# Patient Record
Sex: Female | Born: 1995 | Race: Black or African American | Hispanic: No | Marital: Single | Smoking: Never smoker
Health system: Southern US, Community
[De-identification: ages and names within clinical notes are randomized; demographics above are authoritative.]

## PROBLEM LIST (undated history)

## (undated) DIAGNOSIS — T148XXA Other injury of unspecified body region, initial encounter: Secondary | ICD-10-CM

## (undated) DIAGNOSIS — R011 Cardiac murmur, unspecified: Secondary | ICD-10-CM

## (undated) DIAGNOSIS — E669 Obesity, unspecified: Secondary | ICD-10-CM

---
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
## 2015-08-11 ENCOUNTER — Emergency Department (HOSPITAL_COMMUNITY)
Admission: EM | Admit: 2015-08-11 | Discharge: 2015-08-12 | Disposition: A | Discharge status: Home / Self Care | Payer: Medicaid Other | Attending: Emergency Medicine | Admitting: Emergency Medicine

## 2015-08-11 ENCOUNTER — Emergency Department (HOSPITAL_COMMUNITY): Payer: Medicaid Other

## 2015-08-11 ENCOUNTER — Encounter (HOSPITAL_COMMUNITY): Payer: Self-pay | Admitting: Emergency Medicine

## 2015-08-11 DIAGNOSIS — Z791 Long term (current) use of non-steroidal anti-inflammatories (NSAID): Secondary | ICD-10-CM | POA: Insufficient documentation

## 2015-08-11 DIAGNOSIS — M79641 Pain in right hand: Secondary | ICD-10-CM | POA: Diagnosis present

## 2015-08-11 DIAGNOSIS — Z79899 Other long term (current) drug therapy: Secondary | ICD-10-CM | POA: Diagnosis not present

## 2015-08-11 DIAGNOSIS — M6740 Ganglion, unspecified site: Secondary | ICD-10-CM

## 2015-08-11 DIAGNOSIS — M67441 Ganglion, right hand: Secondary | ICD-10-CM | POA: Insufficient documentation

## 2015-08-11 HISTORY — DX: Cardiac murmur, unspecified: R01.1

## 2015-08-11 MED ORDER — NAPROXEN 375 MG PO TABS: 375.0000 mg | ORAL_TABLET | Freq: Two times a day (BID) | ORAL | 0 refills | Status: DC | PRN

## 2015-08-11 NOTE — ED Triage Notes (Signed)
Patient reports intermittent knot to right hand x7-8 years.  Reports that this has gotten bigger in the past year.  Reports that she hit the knot tonight on a car door and began feeling numbness in the right hand. Presently reports tingling to right hand.

## 2015-08-11 NOTE — ED Provider Notes (Signed)
[facility]-EMERGENCY DEPT Provider Note   CSN: [id number] Arrival date & time: 08/11/15  2200  First Provider Contact:  First MD Initiated Contact with Patient 08/11/15 2230        History   Chief Complaint Chief Complaint  Patient presents with  . Cyst    HPI Cindy Cross is a 20 y.o. female.  HPI  20 yo RHD female who present with mild right hand pain. Pt states she has had a "little bubble" on the back of her hand for several years. Earlier tonight, she bumped the back of her hand and experiences transient shooting pain down to her fingers. Pain is now largely resolved. She has only mild, aching, pain localized to dorsum of her hand. No redness. No fever or chills.  Past Medical History:  Diagnosis Date  . Murmur, cardiac     Patient Active Problem List   Diagnosis Date Noted  . Heart palpitations 06/06/2014    History reviewed. No pertinent surgical history.  OB History    No data available       Home Medications    Prior to Admission medications   Medication Sig Start Date End Date Taking? Authorizing Provider  ibuprofen (ADVIL,MOTRIN) 200 MG tablet Take 400 mg by mouth every 6 (six) hours as needed for moderate pain.   Yes Historical Provider, MD  MICROGESTIN FE 1.5/30 1.5-30 MG-MCG tablet Take 1 tablet by mouth daily.  05/30/14  Yes Historical Provider, MD  naproxen (NAPROSYN) 375 MG tablet Take 1 tablet (375 mg total) by mouth 2 (two) times daily as needed for moderate pain. 08/11/15   Shaune Pollackameron Sheilyn Boehlke, MD    Family History Family History  Problem Relation Age of Onset  . Cancer Maternal Aunt   . Hypertension Maternal Grandmother   . Stroke Maternal Grandmother   . Heart disease Maternal Grandmother   . Diabetes Maternal Grandfather   . Hypertension Maternal Grandfather   . Diabetes Mother   . Asthma Mother     Social History Social History  Substance Use Topics  . Smoking status: Never Smoker  . Smokeless tobacco: Never Used  . Alcohol use No      Allergies   Review of patient's allergies indicates no known allergies.   Review of Systems Review of Systems  Constitutional: Negative for chills, fatigue and fever.  HENT: Negative for congestion and rhinorrhea.   Eyes: Negative for visual disturbance.  Respiratory: Negative for cough, shortness of breath and wheezing.   Cardiovascular: Negative for chest pain and leg swelling.  Gastrointestinal: Negative for abdominal pain, diarrhea, nausea and vomiting.  Genitourinary: Negative for dysuria and flank pain.  Musculoskeletal: Negative for neck pain and neck stiffness.  Skin: Negative for rash and wound.  Allergic/Immunologic: Negative for immunocompromised state.  Neurological: Negative for syncope, weakness and headaches.     Physical Exam Updated Vital Signs BP 138/86 (BP Location: Left Arm)   Pulse 70   Temp 98.6 F (37 C) (Oral)   Resp 18   LMP 07/24/2015 (Exact Date)   SpO2 96%   Physical Exam  Constitutional: She is oriented to person, place, and time. She appears well-developed and well-nourished. No distress.  HENT:  Head: Normocephalic and atraumatic.  Eyes: Conjunctivae are normal.  Neck: Neck supple.  Cardiovascular: Normal rate, regular rhythm and normal heart sounds.   Pulmonary/Chest: Effort normal. No respiratory distress. She has no wheezes.  Abdominal: She exhibits no distension.  Musculoskeletal: She exhibits no edema.  Neurological: She is alert and  oriented to person, place, and time. She exhibits normal muscle tone.  Skin: Skin is warm. Capillary refill takes less than 2 seconds. No rash noted.  Nursing note and vitals reviewed.   UPPER EXTREMITY EXAM: RIGHT  INSPECTION & PALPATION: Small, mobile, non-tender subdermal cyst on dorsum of hand overlying extensor tendon of middle finger at wrist. No overlying skin erythema or redness. No fluctuance.  SENSORY: Sensation is intact to light touch in:  Superficial radial nerve distribution  (dorsal first web space) Median nerve distribution (tip of index finger)   Ulnar nerve distribution (tip of small finger)     MOTOR:  + Motor posterior interosseous nerve (thumb IP extension) + Anterior interosseous nerve (thumb IP flexion, index finger DIP flexion) + Radial nerve (wrist extension) + Median nerve (palpable firing thenar mass) + Ulnar nerve (palpable firing of first dorsal interosseous muscle)  VASCULAR: 2+ radial pulse Brisk capillary refill < 2 sec, fingers warm and well-perfused  TENDONS: Tested individual flexion at MCP, PIP and DIP joints of index, long, ring and small fingers and all are intact. Tested extension of DIP/PIP joints of index, long, ring and small fingers and all intact.  COMPARTMENTS: Soft, warm, well-perfused No pain with passive extension No paresthesias  ED Treatments / Results  Labs (all labs ordered are listed, but only abnormal results are displayed) Labs Reviewed - No data to display  EKG  EKG Interpretation None       Radiology Dg Hand Complete Right  Result Date: 08/11/2015 CLINICAL DATA:  Chronic knot at the medial aspect of the right hand. Acute onset of numbness after hitting knot on car door. Initial encounter. EXAM: RIGHT HAND - COMPLETE 3+ VIEW COMPARISON:  None. FINDINGS: There is no evidence of fracture or dislocation. The joint spaces are preserved. The carpal rows are intact, and demonstrate normal alignment. The known soft tissue nodule is not well characterized on radiograph. No radiopaque foreign bodies are seen. IMPRESSION: No evidence of fracture or dislocation. Electronically Signed   By: Roanna Raider M.D.   On: 08/11/2015 22:47    Procedures Procedures (including critical care time)  Medications Ordered in ED Medications - No data to display   Initial Impression / Assessment and Plan / ED Course  I have reviewed the triage vital signs and the nursing notes.  Pertinent labs & imaging results that were  available during my care of the patient were reviewed by me and considered in my medical decision making (see chart for details).  Clinical Course   20 yo F with no significant PMHx who p/w transient pain due to striking dorsum of hand against hard object. Exam as above and is c/w mild ganglion cyst. No surrounding erythema, redness, fluctuance, ro signs of superinfection. Cyst is small and is o/w asymptomatic. No tendon injury. Plain films show no fx. Will advise supportive care, outpt referral for possible cyst aspiration/removal. Pt in agreement.  Final Clinical Impressions(s) / ED Diagnoses   Final diagnoses:  Ganglion cyst    New Prescriptions Discharge Medication List as of 08/11/2015 11:18 PM       Shaune Pollack, MD 08/12/15 1158

## 2015-11-02 ENCOUNTER — Encounter: Payer: Self-pay | Admitting: Obstetrics & Gynecology

## 2015-12-14 ENCOUNTER — Ambulatory Visit (INDEPENDENT_AMBULATORY_CARE_PROVIDER_SITE_OTHER): Payer: Medicaid Other | Admitting: Obstetrics & Gynecology

## 2015-12-14 ENCOUNTER — Other Ambulatory Visit (HOSPITAL_COMMUNITY)
Admission: RE | Admit: 2015-12-14 | Discharge: 2015-12-14 | Disposition: A | Discharge status: Home / Self Care | Payer: Medicaid Other | Source: Ambulatory Visit | Attending: Obstetrics & Gynecology | Admitting: Obstetrics & Gynecology

## 2015-12-14 ENCOUNTER — Encounter: Payer: Self-pay | Admitting: Obstetrics & Gynecology

## 2015-12-14 VITALS — BP 156/77 | HR 69 | Ht 63.0 in | Wt 208.0 lb

## 2015-12-14 DIAGNOSIS — Z202 Contact with and (suspected) exposure to infections with a predominantly sexual mode of transmission: Secondary | ICD-10-CM

## 2015-12-14 DIAGNOSIS — Z113 Encounter for screening for infections with a predominantly sexual mode of transmission: Secondary | ICD-10-CM | POA: Insufficient documentation

## 2015-12-14 DIAGNOSIS — Z23 Encounter for immunization: Secondary | ICD-10-CM

## 2015-12-14 MED ORDER — HPV QUADRIVALENT VACCINE IM SUSP: 0.5000 mL | Freq: Once | INTRAMUSCULAR | 0 refills | Status: AC

## 2015-12-14 NOTE — Progress Notes (Signed)
   Subjective:    Patient ID: Cindy Cross, female    DOB: 09/24/1995, 20 y.o.   MRN: [id number]  HPI  20 yo S AA here as a referral after her primary care saw a red area on her cervix during her pap smear last month.  Review of Systems She was treated for + CT but has not had a TOC. She uses Nexplanon for contraception.    Objective:   Physical Exam Obese pleasant black female Breathing, conversing, and ambulating normally Cervix- normal appearing ectropion (discussion about this done) Some menstrual blood noted No abnormality noted    Assessment & Plan:  Preventative care- start Gardasil today H/O + CT- check TOC Reassurance given regarding the ectropion RTC 2 months for Gardasill #2

## 2015-12-15 LAB — GC/CHLAMYDIA PROBE AMP (~~LOC~~) NOT AT ARMC
CHLAMYDIA, DNA PROBE: NEGATIVE
Neisseria Gonorrhea: NEGATIVE

## 2016-02-15 ENCOUNTER — Ambulatory Visit (INDEPENDENT_AMBULATORY_CARE_PROVIDER_SITE_OTHER): Payer: Medicaid Other | Admitting: *Deleted

## 2016-02-15 ENCOUNTER — Encounter: Payer: Self-pay | Admitting: Obstetrics & Gynecology

## 2016-02-15 ENCOUNTER — Other Ambulatory Visit (HOSPITAL_COMMUNITY)
Admission: RE | Admit: 2016-02-15 | Discharge: 2016-02-15 | Disposition: A | Discharge status: Home / Self Care | Payer: Medicaid Other | Source: Ambulatory Visit | Attending: Obstetrics & Gynecology | Admitting: Obstetrics & Gynecology

## 2016-02-15 DIAGNOSIS — Z113 Encounter for screening for infections with a predominantly sexual mode of transmission: Secondary | ICD-10-CM

## 2016-02-15 DIAGNOSIS — B9689 Other specified bacterial agents as the cause of diseases classified elsewhere: Secondary | ICD-10-CM

## 2016-02-15 DIAGNOSIS — Z23 Encounter for immunization: Secondary | ICD-10-CM | POA: Diagnosis present

## 2016-02-15 DIAGNOSIS — N760 Acute vaginitis: Secondary | ICD-10-CM

## 2016-02-16 LAB — CERVICOVAGINAL ANCILLARY ONLY
Bacterial vaginitis: POSITIVE — AB
CANDIDA VAGINITIS: NEGATIVE
Chlamydia: NEGATIVE
Neisseria Gonorrhea: NEGATIVE
TRICH (WINDOWPATH): NEGATIVE

## 2016-02-17 MED ORDER — METRONIDAZOLE 500 MG PO TABS: 500.0000 mg | ORAL_TABLET | Freq: Two times a day (BID) | ORAL | 0 refills | Status: DC

## 2016-02-17 NOTE — Progress Notes (Addendum)
Patient was seen as a RN visit for abnormal vaginal discharge, also desires STI testing. Testing done, showed bacterial vaginitis.  Results for orders placed or performed in visit on 02/15/16 (from the past 72 hour(s))  Cervicovaginal ancillary only     Status: Abnormal   Collection Time: 02/15/16 12:00 AM  Result Value Ref Range   Chlamydia Negative     Comment: Normal Reference Range - Negative   Neisseria gonorrhea Negative     Comment: Normal Reference Range - Negative   Bacterial vaginitis **POSITIVE for Gardnerella vaginalis** (A)     Comment: Normal Reference Range - Negative   Candida vaginitis Negative for Candida species     Comment: Normal Reference Range - Negative   Trichomonas Negative     Comment: Normal Reference Range - Negative    Metronidazole prescribed for patient; she will be contacted by RN and instructed to pick up prescription.   Jaynie CollinsUGONNA  Clemens Lachman, MD, FACOG Attending Obstetrician & Gynecologist, [facility], [facility]

## 2016-02-17 NOTE — Addendum Note (Signed)
Addended by: Jaynie CollinsANYANWU, Amilio Zehnder A on: 02/17/2016 03:02 PM   Modules accepted: Orders, Level of Service

## 2016-03-24 ENCOUNTER — Encounter: Payer: Self-pay | Admitting: *Deleted

## 2016-06-13 ENCOUNTER — Ambulatory Visit: Payer: Medicaid Other

## 2016-06-14 ENCOUNTER — Ambulatory Visit (INDEPENDENT_AMBULATORY_CARE_PROVIDER_SITE_OTHER): Payer: Medicaid Other

## 2016-06-14 VITALS — BP 135/77 | HR 71

## 2016-06-14 DIAGNOSIS — Z23 Encounter for immunization: Secondary | ICD-10-CM

## 2016-06-14 NOTE — Progress Notes (Signed)
Patient presented to office today for HPV #3. Patient tolerated well.

## 2016-10-06 ENCOUNTER — Ambulatory Visit (INDEPENDENT_AMBULATORY_CARE_PROVIDER_SITE_OTHER): Payer: Self-pay | Admitting: Clinical

## 2016-10-06 ENCOUNTER — Encounter: Payer: Self-pay | Admitting: Obstetrics & Gynecology

## 2016-10-06 ENCOUNTER — Other Ambulatory Visit (HOSPITAL_COMMUNITY)
Admission: RE | Admit: 2016-10-06 | Discharge: 2016-10-06 | Disposition: A | Discharge status: Home / Self Care | Payer: Medicaid Other | Source: Ambulatory Visit | Attending: Obstetrics & Gynecology | Admitting: Obstetrics & Gynecology

## 2016-10-06 ENCOUNTER — Ambulatory Visit (INDEPENDENT_AMBULATORY_CARE_PROVIDER_SITE_OTHER): Payer: Medicaid Other | Admitting: Obstetrics & Gynecology

## 2016-10-06 VITALS — BP 156/104 | HR 79 | Wt 207.1 lb

## 2016-10-06 DIAGNOSIS — Z1331 Encounter for screening for depression: Secondary | ICD-10-CM

## 2016-10-06 DIAGNOSIS — Z309 Encounter for contraceptive management, unspecified: Secondary | ICD-10-CM | POA: Diagnosis present

## 2016-10-06 DIAGNOSIS — F332 Major depressive disorder, recurrent severe without psychotic features: Secondary | ICD-10-CM

## 2016-10-06 DIAGNOSIS — Z01419 Encounter for gynecological examination (general) (routine) without abnormal findings: Secondary | ICD-10-CM | POA: Diagnosis present

## 2016-10-06 DIAGNOSIS — B9689 Other specified bacterial agents as the cause of diseases classified elsewhere: Secondary | ICD-10-CM | POA: Insufficient documentation

## 2016-10-06 NOTE — Progress Notes (Signed)
Subjective:    Cindy Cross is a 21 y.o. S AA G0  female who presents for an annual exam. The patient has no complaints today except for vaginal itching/discharge. Coitarche at 17, body count "more than 5 and less than 15". The patient is sexually active. GYN screening history: no prior history of gyn screening tests. The patient wears seatbelts: yes. The patient participates in regular exercise: yes. Has the patient ever been transfused or tattooed?: yes. The patient reports that there is not domestic violence in her life.   Menstrual History: OB History    No data available      Menarche age: 60 No LMP recorded. Patient has had an implant.    The following portions of the patient's history were reviewed and updated as appropriate: allergies, current medications, past family history, past medical history, past social history, past surgical history and problem list.  Review of Systems Pertinent items are noted in HPI.   Student at [employer], Multimedia programmer Uses condoms at times., She had CT about a year ago. FH- +breast cancer- maunt, no gyn or colon cancer Periods are irregular, but monthly and last up to 30 days Has Nexplanon She has been monogamous for 7 years.   Objective:    BP (!) 156/104   Pulse 79   Wt 207 lb 1.6 oz (93.9 kg)   BMI 36.69 kg/m   General Appearance:    Alert, cooperative, no distress, appears stated age  Head:    Normocephalic, without obvious abnormality, atraumatic  Eyes:    PERRL, conjunctiva/corneas clear, EOM's intact, fundi    benign, both eyes  Ears:    Normal TM's and external ear canals, both ears  Nose:   Nares normal, septum midline, mucosa normal, no drainage    or sinus tenderness  Throat:   Lips, mucosa, and tongue normal; teeth and gums normal  Neck:   Supple, symmetrical, trachea midline, no adenopathy;    thyroid:  no enlargement/tenderness/nodules; no carotid   bruit or JVD  Back:     Symmetric, no curvature, ROM normal,  no CVA tenderness  Lungs:     Clear to auscultation bilaterally, respirations unlabored  Chest Wall:    No tenderness or deformity   Heart:    RRR with 3/6 SEM  Breast Exam:    No tenderness, masses, or nipple abnormality  Abdomen:     Soft, non-tender, bowel sounds active all four quadrants,    no masses, no organomegaly  Genitalia:    Normal female without lesion, discharge or tenderness, bloody mucous discharge, NSSA, mobile, NT, no adnexal masses or tenderness     Extremities:   Extremities normal, atraumatic, no cyanosis or edema  Pulses:   2+ and symmetric all extremities  Skin:   Skin color, texture, turgor normal, no rashes or lesions  Lymph nodes:   Cervical, supraclavicular, and axillary nodes normal  Neurologic:   CNII-XII intact, normal strength, sensation and reflexes    throughout  .    Assessment:    Healthy female exam.    Plan:     Thin prep Pap smear.   STI testing She will make appt with FP re her BP Rec healthy lifestyle choices

## 2016-10-06 NOTE — Patient Instructions (Signed)
My Safety Plan:   Step 1: Warning signs (thoughts, images, mood, situation, behavior) that a crisis may be developing: If stress becomes overwhelming  Step 2: Internal coping strategies: Things I can do to take my mind off my problems without contacting another person (music, relaxation technique, physical activity): music  Step 3: People I can ask for help:  Name, relationship, contact: Ardelle Park, [phone] Name, relationship, contact: Iris, [phone] Name, relationship, contact: Johnny Bridge, (number in phone)_______________________  Step 5: Agencies I can contact during a crisis:      1. 9-1-1     2. [employer] (24/7 walk-in) [address], [place], [place]     3. [facility]: Intake- [id number] [phone]     4. Closest Emergency Room Address: [place] below)  Suicide Prevention Lifeline Phone: [phone]  Step 6: Making the environment safe- Have friend or family remove from home: Mommy will do this at home * Weapons in the home * Medication in the home (including Tylenol)  Step 7: The one thing that is most important to me and worth living for is: Tour manager of Patient: _________________________________________________  Public house manager of Provider: ________________________________________________  [facility] [address] [place], [place], [place] [phone]  Eligha Bridegroom. [facility] [address], [place], [place] [phone]  [facility] [address] [place], [place] [phone]  [facility] [address] [phone]  [facility] [address], [place], [place] [phone]

## 2016-10-06 NOTE — BH Specialist Note (Signed)
Integrated Behavioral Health Initial Visit  MRN: [id number] Name: Cindy Cross  Number of Integrated Behavioral Health Clinician visits:: 1/6 Session Start time: 4:15  Session End time: 5:15 Total time: 1 hour  Type of [id number] Behavioral Health- Individual/Family Interpretor:No. Interpretor Name and Language: n/a   Warm Hand Off Completed.       SUBJECTIVE: Cindy Cross is a 21 y.o. female accompanied by n/a Patient was referred by Dr Marice Potter for depression. Patient reports the following symptoms/concerns: Pt states her primary concern today are recent thoughts of childhood losses(grandmother and father), and the realization that she needs to find ways to cope with daily stress. Pt says she is not currently suicidal, denies any current plan of harm to self.  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 Reduce symptoms of: anxiety, depression and stress 2. Increase knowledge and/or ability of: coping skills  3. Demonstrate ability to: Increase healthy adjustment to current life circumstances and Increase adequate support systems for patient/family  INTERVENTIONS: Interventions utilized: Solution-Focused Strategies, Mindfulness or Management consultant and Psychoeducation and/or Health Education  Standardized Assessments completed: GAD-7 and PHQ 2&9 with C-SSRS Pt denies current SI, says she has been reminded of loss and suicide attempt from childhood, after her  grandmother and father passed away; has had no plan or attempt since that time. Thinking about her past losses have increased her feelings of depression recently.   ASSESSMENT: Patient currently experiencing Major depressive disorder, recurrent, severe, without psychotic features.   Patient may benefit from psychoeducation and brief therapeutic intervention regarding coping with symptoms of anxiety and depression, and referral to community mental health.  PLAN: 1. Follow up with behavioral health clinician on :  Phone f/u 4 days 2. Behavioral recommendations:  -Follow safety plan -Make an appointment at [facility] -Consider establishing care at either [facility] or [facility] for [facility] med management -CALM relaxation breathing exercise daily, as needed -Consider Worry Hour strategy to prioritize current life stress -Consider spending more time with family pets -Read educational materials regarding coping with symptoms of anxiety with panic and depression 3. Referral(s): Integrated Art gallery manager (In Clinic) and [facility] (LME/Outside Clinic) 4. "From scale of 1-10, how likely are you to follow plan?": 8  Rae Lips, LCSWA    Depression screen [facility] 2/9 10/06/2016 12/14/2015  Decreased Interest 0 1  Down, Depressed, Hopeless 2 2  PHQ - 2 Score 2 3  Altered sleeping 2 1  Tired, decreased energy 2 1  Change in appetite 1 1  Feeling bad or failure about yourself  2 1  Trouble concentrating 1 1  Moving slowly or fidgety/restless 1 0  Suicidal thoughts 2 1  PHQ-9 Score 13 9   GAD 7 : Generalized Anxiety Score 10/06/2016 12/14/2015  Nervous, Anxious, on Edge 3 2  Control/stop worrying 3 2  Worry too much - different things 3 2  Trouble relaxing 2 0  Restless 1 0  Easily annoyed or irritable 2 3  Afraid - awful  might happen 3 0  Total GAD 7 Score 17 9

## 2016-10-07 LAB — CYTOLOGY - PAP
Bacterial vaginitis: POSITIVE — AB
CANDIDA VAGINITIS: NEGATIVE
CHLAMYDIA, DNA PROBE: NEGATIVE
DIAGNOSIS: NEGATIVE
Neisseria Gonorrhea: NEGATIVE
Trichomonas: NEGATIVE

## 2016-10-07 LAB — RPR: RPR Ser Ql: NONREACTIVE

## 2016-10-07 LAB — HEPATITIS PANEL, ACUTE
HEP B S AG: NEGATIVE
Hep A IgM: NEGATIVE
Hep B C IgM: NEGATIVE

## 2016-10-07 LAB — HIV ANTIBODY (ROUTINE TESTING W REFLEX): HIV SCREEN 4TH GENERATION: NONREACTIVE

## 2016-10-10 ENCOUNTER — Telehealth: Payer: Self-pay | Admitting: Clinical

## 2016-10-10 NOTE — Telephone Encounter (Signed)
Left HIPPA-compliant message to return call to Jream Broyles at [facility] at [phone].      

## 2017-04-26 ENCOUNTER — Ambulatory Visit (INDEPENDENT_AMBULATORY_CARE_PROVIDER_SITE_OTHER): Payer: PRIVATE HEALTH INSURANCE

## 2017-04-26 ENCOUNTER — Other Ambulatory Visit: Payer: Self-pay

## 2017-04-26 ENCOUNTER — Ambulatory Visit (HOSPITAL_COMMUNITY)
Admission: EM | Admit: 2017-04-26 | Discharge: 2017-04-26 | Disposition: A | Discharge status: Home / Self Care | Payer: PRIVATE HEALTH INSURANCE | Attending: Family Medicine | Admitting: Family Medicine

## 2017-04-26 ENCOUNTER — Encounter (HOSPITAL_COMMUNITY): Payer: Self-pay | Admitting: Emergency Medicine

## 2017-04-26 DIAGNOSIS — S82832A Other fracture of upper and lower end of left fibula, initial encounter for closed fracture: Secondary | ICD-10-CM
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
## 2017-04-26 NOTE — Discharge Instructions (Addendum)
You may use over the counter ibuprofen or acetaminophen as needed. Wear your walking boot until orthopaedic follow up.

## 2017-05-04 ENCOUNTER — Encounter (HOSPITAL_COMMUNITY): Payer: Self-pay | Admitting: *Deleted

## 2017-05-04 ENCOUNTER — Other Ambulatory Visit: Payer: Self-pay

## 2017-05-04 NOTE — Progress Notes (Signed)
Anesthesia Chart Review:  Pt is a same day work up   Case:  [id number] Date/Time:  05/08/17 1115   Procedure:  OPEN REDUCTION INTERNAL FIXATION (ORIF) LEFT ANKLE FRACTURE (Left )   Anesthesia type:  General   Pre-op diagnosis:  Left ankle fracture   Location:  [facility] OR ROOM 05 / [facility] OR   Surgeon:  Cindy Kida, MD      DISCUSSION: Pt is a 22 year old female.    PROVIDERS:  PCP is Cindy Sessions, NP    Saw cardiologist Cindy Shutter, MD 06/06/14 for palpitations.  SEM heard, thought physiologic.  Echo (see below) showed aortic sclerosis.  Pt does not regularly f/u with cardiology.     LABS: Will be obtained day of surgery   EKG: Will be obtained day of surgery    CV:  Echo 06/18/14:  - Left ventricle: The cavity size was normal. Systolic function was normal. The estimated ejection fraction was in the range of 55% to 60%. Wall motion was normal; there were no regional wall motion abnormalities. - Aortic valve: Trileaflet; mildly thickened, mildly calcified leaflets. - Mitral valve: There was trivial regurgitation. - Tricuspid valve: There was trivial regurgitation.   Past Medical History:  Diagnosis Date  . Fracture    left fibula  . Murmur, cardiac   . Obesity     History reviewed. No pertinent surgical history.  MEDICATIONS: No current facility-administered medications for this encounter.    . etonogestrel (NEXPLANON) 68 MG IMPL implant  . ibuprofen (ADVIL,MOTRIN) 200 MG tablet    If labs and EKG acceptable day of surgery, I anticipate pt can proceed with surgery as scheduled.  Cindy Mast, FNP-BC [facility] Short Stay Surgical Center/Anesthesiology Phone: [phone] 05/04/2017 3:50 PM
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
## 2017-05-04 NOTE — Progress Notes (Signed)
Pt denies SOB, chest pain, and being under the care of a cardiologist. Pt denies having a stress test and cardiac cath. Pt denies having an EKG and chest x ray within the last year. Pt denies recent labs.  Pt made aware to stop taking vitamins, fish oil and herbal medications. Do not take any NSAIDs ie: Ibuprofen, Advil, Naproxen (Aleve), Motrin, BC and Goody Powder. Pt verbalized understanding of all pre-op instructions. See anesthesia note.

## 2017-05-08 ENCOUNTER — Encounter (HOSPITAL_COMMUNITY): Payer: Self-pay | Admitting: *Deleted

## 2017-05-08 ENCOUNTER — Ambulatory Visit (HOSPITAL_COMMUNITY): Payer: PRIVATE HEALTH INSURANCE

## 2017-05-08 ENCOUNTER — Ambulatory Visit (HOSPITAL_COMMUNITY)
Admission: RE | Admit: 2017-05-08 | Discharge: 2017-05-08 | Disposition: A | Discharge status: Home / Self Care | Payer: PRIVATE HEALTH INSURANCE | Source: Ambulatory Visit | Attending: Orthopedic Surgery | Admitting: Orthopedic Surgery

## 2017-05-08 ENCOUNTER — Ambulatory Visit (HOSPITAL_COMMUNITY): Payer: PRIVATE HEALTH INSURANCE | Admitting: Emergency Medicine

## 2017-05-08 ENCOUNTER — Encounter (HOSPITAL_COMMUNITY)
Admission: RE | Disposition: A | Discharge status: Home / Self Care | Payer: Self-pay | Source: Ambulatory Visit | Attending: Orthopedic Surgery

## 2017-05-08 DIAGNOSIS — S82842A Displaced bimalleolar fracture of left lower leg, initial encounter for closed fracture: Secondary | ICD-10-CM | POA: Insufficient documentation

## 2017-05-08 DIAGNOSIS — Y929 Unspecified place or not applicable: Secondary | ICD-10-CM | POA: Diagnosis not present

## 2017-05-08 DIAGNOSIS — W19XXXA Unspecified fall, initial encounter: Secondary | ICD-10-CM | POA: Insufficient documentation

## 2017-05-08 DIAGNOSIS — Z419 Encounter for procedure for purposes other than remedying health state, unspecified: Secondary | ICD-10-CM

## 2017-05-08 DIAGNOSIS — S93432A Sprain of tibiofibular ligament of left ankle, initial encounter: Secondary | ICD-10-CM | POA: Diagnosis not present

## 2017-05-08 DIAGNOSIS — S82892A Other fracture of left lower leg, initial encounter for closed fracture: Secondary | ICD-10-CM

## 2017-05-08 HISTORY — DX: Other injury of unspecified body region, initial encounter: T14.8XXA

## 2017-05-08 HISTORY — PX: ORIF ANKLE FRACTURE: SHX5408

## 2017-05-08 HISTORY — DX: Obesity, unspecified: E66.9

## 2017-05-08 LAB — CBC
HCT: 42.9 % (ref 36.0–46.0)
HEMOGLOBIN: 14 g/dL (ref 12.0–15.0)
MCH: 29.3 pg (ref 26.0–34.0)
MCHC: 32.6 g/dL (ref 30.0–36.0)
MCV: 89.7 fL (ref 78.0–100.0)
Platelets: 295 10*3/uL (ref 150–400)
RBC: 4.78 MIL/uL (ref 3.87–5.11)
RDW: 13.2 % (ref 11.5–15.5)
WBC: 4.6 10*3/uL (ref 4.0–10.5)

## 2017-05-08 LAB — BASIC METABOLIC PANEL
Anion gap: 11 (ref 5–15)
BUN: 12 mg/dL (ref 6–20)
CHLORIDE: 103 mmol/L (ref 101–111)
CO2: 22 mmol/L (ref 22–32)
Calcium: 9.2 mg/dL (ref 8.9–10.3)
Creatinine, Ser: 0.81 mg/dL (ref 0.44–1.00)
GFR calc Af Amer: >60 mL/min (ref >60)
GFR calc non Af Amer: >60 mL/min (ref >60)
GLUCOSE: 94 mg/dL (ref 65–99)
POTASSIUM: 3.5 mmol/L (ref 3.5–5.1)
SODIUM: 136 mmol/L (ref 135–145)

## 2017-05-08 LAB — POCT PREGNANCY, URINE: PREG TEST UR: NEGATIVE

## 2017-05-08 SURGERY — OPEN REDUCTION INTERNAL FIXATION (ORIF) ANKLE FRACTURE
Anesthesia: General | Site: Ankle | Laterality: Left

## 2017-05-08 MED ORDER — OXYCODONE HCL 5 MG/5ML PO SOLN: 5.0000 mg | Freq: Once | ORAL | Status: DC | PRN

## 2017-05-08 MED ORDER — PROPOFOL 10 MG/ML IV BOLUS
INTRAVENOUS | Status: AC
  Filled 2017-05-08: qty 20

## 2017-05-08 MED ORDER — PROPOFOL 10 MG/ML IV BOLUS
INTRAVENOUS | Status: DC | PRN
  Administered 2017-05-08: 150 mg via INTRAVENOUS
  Administered 2017-05-08: 200 mg via INTRAVENOUS

## 2017-05-08 MED ORDER — FENTANYL CITRATE (PF) 100 MCG/2ML IJ SOLN
INTRAMUSCULAR | Status: AC
  Administered 2017-05-08: 50 ug
  Filled 2017-05-08: qty 2

## 2017-05-08 MED ORDER — FENTANYL CITRATE (PF) 250 MCG/5ML IJ SOLN
INTRAMUSCULAR | Status: DC | PRN
  Administered 2017-05-08: 50 ug via INTRAVENOUS

## 2017-05-08 MED ORDER — ONDANSETRON HCL 4 MG/2ML IJ SOLN
INTRAMUSCULAR | Status: DC | PRN
  Administered 2017-05-08: 4 mg via INTRAVENOUS

## 2017-05-08 MED ORDER — BUPIVACAINE HCL (PF) 0.25 % IJ SOLN
INTRAMUSCULAR | Status: AC
  Filled 2017-05-08: qty 30

## 2017-05-08 MED ORDER — PROMETHAZINE HCL 25 MG/ML IJ SOLN: 6.2500 mg | INTRAMUSCULAR | Status: DC | PRN

## 2017-05-08 MED ORDER — DEXAMETHASONE SODIUM PHOSPHATE 10 MG/ML IJ SOLN
INTRAMUSCULAR | Status: DC | PRN
  Administered 2017-05-08: 10 mg via INTRAVENOUS

## 2017-05-08 MED ORDER — ONDANSETRON 4 MG PO TBDP: 4.0000 mg | ORAL_TABLET | Freq: Three times a day (TID) | ORAL | 0 refills | Status: DC | PRN

## 2017-05-08 MED ORDER — MIDAZOLAM HCL 2 MG/2ML IJ SOLN
INTRAMUSCULAR | Status: AC
  Filled 2017-05-08: qty 2

## 2017-05-08 MED ORDER — 0.9 % SODIUM CHLORIDE (POUR BTL) OPTIME
TOPICAL | Status: DC | PRN
  Administered 2017-05-08: 1000 mL

## 2017-05-08 MED ORDER — LACTATED RINGERS IV SOLN
INTRAVENOUS | Status: DC
  Administered 2017-05-08 (×2): via INTRAVENOUS

## 2017-05-08 MED ORDER — CHLORHEXIDINE GLUCONATE 4 % EX LIQD: 60.0000 mL | Freq: Once | CUTANEOUS | Status: DC

## 2017-05-08 MED ORDER — OXYCODONE HCL 5 MG PO TABS: 5.0000 mg | ORAL_TABLET | ORAL | 0 refills | Status: AC | PRN

## 2017-05-08 MED ORDER — CEFAZOLIN SODIUM-DEXTROSE 2-4 GM/100ML-% IV SOLN
2.0000 g | INTRAVENOUS | Status: AC
  Administered 2017-05-08: 2 g via INTRAVENOUS
  Filled 2017-05-08: qty 100

## 2017-05-08 MED ORDER — OXYCODONE HCL 5 MG PO TABS: 5.0000 mg | ORAL_TABLET | Freq: Once | ORAL | Status: DC | PRN

## 2017-05-08 MED ORDER — FENTANYL CITRATE (PF) 100 MCG/2ML IJ SOLN: 25.0000 ug | INTRAMUSCULAR | Status: DC | PRN

## 2017-05-08 MED ORDER — DEXMEDETOMIDINE HCL 200 MCG/2ML IV SOLN
INTRAVENOUS | Status: DC | PRN
  Administered 2017-05-08 (×4): 4 ug via INTRAVENOUS

## 2017-05-08 MED ORDER — MIDAZOLAM HCL 2 MG/2ML IJ SOLN
INTRAMUSCULAR | Status: AC
  Administered 2017-05-08: 2 mg
  Filled 2017-05-08: qty 2

## 2017-05-08 MED ORDER — LIDOCAINE 2% (20 MG/ML) 5 ML SYRINGE
INTRAMUSCULAR | Status: DC | PRN
  Administered 2017-05-08: 60 mg via INTRAVENOUS

## 2017-05-08 MED ORDER — FENTANYL CITRATE (PF) 250 MCG/5ML IJ SOLN
INTRAMUSCULAR | Status: AC
  Filled 2017-05-08: qty 5

## 2017-05-08 MED ORDER — BUPIVACAINE-EPINEPHRINE (PF) 0.5% -1:200000 IJ SOLN
INTRAMUSCULAR | Status: DC | PRN
  Administered 2017-05-08: 30 mL via PERINEURAL
  Administered 2017-05-08: 20 mL via PERINEURAL

## 2017-05-08 SURGICAL SUPPLY — 50 items
BANDAGE ELASTIC 4 VELCRO ST LF (GAUZE/BANDAGES/DRESSINGS) ×2 IMPLANT
BANDAGE ELASTIC 6 VELCRO ST LF (GAUZE/BANDAGES/DRESSINGS) ×2 IMPLANT
BANDAGE ESMARK 6X9 LF (GAUZE/BANDAGES/DRESSINGS) ×1 IMPLANT
BIT DRILL 2.5 CANN ENDOSCOPIC (BIT) ×2 IMPLANT
BIT DRILL 3.5 CANN STRL (BIT) ×2 IMPLANT
BNDG ESMARK 6X9 LF (GAUZE/BANDAGES/DRESSINGS) ×2
CANISTER SUCT 3000ML PPV (MISCELLANEOUS) ×2 IMPLANT
COVER SURGICAL LIGHT HANDLE (MISCELLANEOUS) ×2 IMPLANT
CUFF TOURNIQUET SINGLE 34IN LL (TOURNIQUET CUFF) ×2 IMPLANT
DRAPE C-ARM 42X72 X-RAY (DRAPES) ×2 IMPLANT
DRAPE C-ARMOR (DRAPES) ×2 IMPLANT
DRAPE U-SHAPE 47X51 STRL (DRAPES) ×2 IMPLANT
DURAPREP 26ML APPLICATOR (WOUND CARE) ×4 IMPLANT
ELECT REM PT RETURN 9FT ADLT (ELECTROSURGICAL) ×2
ELECTRODE REM PT RTRN 9FT ADLT (ELECTROSURGICAL) ×1 IMPLANT
GAUZE SPONGE 4X4 12PLY STRL (GAUZE/BANDAGES/DRESSINGS) ×2 IMPLANT
GLOVE BIO SURGEON STRL SZ7.5 (GLOVE) ×2 IMPLANT
GLOVE BIOGEL PI IND STRL 8 (GLOVE) ×1 IMPLANT
GLOVE BIOGEL PI INDICATOR 8 (GLOVE) ×1
GOWN STRL REUS W/ TWL LRG LVL3 (GOWN DISPOSABLE) ×2 IMPLANT
GOWN STRL REUS W/ TWL XL LVL3 (GOWN DISPOSABLE) ×1 IMPLANT
GOWN STRL REUS W/TWL LRG LVL3 (GOWN DISPOSABLE) ×2
GOWN STRL REUS W/TWL XL LVL3 (GOWN DISPOSABLE) ×1
KIT BASIN OR (CUSTOM PROCEDURE TRAY) ×2 IMPLANT
KIT TURNOVER KIT B (KITS) ×2 IMPLANT
NS IRRIG 1000ML POUR BTL (IV SOLUTION) ×2 IMPLANT
PACK ORTHO EXTREMITY (CUSTOM PROCEDURE TRAY) ×2 IMPLANT
PAD ABD 8X10 STRL (GAUZE/BANDAGES/DRESSINGS) ×2 IMPLANT
PAD ARMBOARD 7.5X6 YLW CONV (MISCELLANEOUS) ×4 IMPLANT
PAD CAST 4YDX4 CTTN HI CHSV (CAST SUPPLIES) ×1 IMPLANT
PADDING CAST COTTON 4X4 STRL (CAST SUPPLIES) ×1
PADDING CAST COTTON 6X4 STRL (CAST SUPPLIES) ×2 IMPLANT
PLATE LOCK THIRD TI 7H TUBULAR (Plate) ×2 IMPLANT
SCREW CORT 3.5X18 THRD (Screw) ×4 IMPLANT
SCREW LP 3.5X12MM (Screw) ×2 IMPLANT
SCREW LP TI 3.5X14MM (Screw) ×6 IMPLANT
SPLINT FIBERGLASS 4X30 (CAST SUPPLIES) ×2 IMPLANT
SPONGE LAP 18X18 X RAY DECT (DISPOSABLE) IMPLANT
STRIP CLOSURE SKIN 1/2X4 (GAUZE/BANDAGES/DRESSINGS) ×2 IMPLANT
SUCTION FRAZIER HANDLE 10FR (MISCELLANEOUS) ×1
SUCTION TUBE FRAZIER 10FR DISP (MISCELLANEOUS) ×1 IMPLANT
SUT ETHILON 3 0 PS 1 (SUTURE) ×2 IMPLANT
SUT VIC AB 0 CT1 27 (SUTURE) ×1
SUT VIC AB 0 CT1 27XBRD ANBCTR (SUTURE) ×1 IMPLANT
SUT VIC AB 2-0 CT1 27 (SUTURE) ×1
SUT VIC AB 2-0 CT1 TAPERPNT 27 (SUTURE) ×1 IMPLANT
SYNDESMOSIS TIGHTROPE XP (Orthopedic Implant) ×2 IMPLANT
TOWEL OR 17X26 10 PK STRL BLUE (TOWEL DISPOSABLE) ×4 IMPLANT
TUBE CONNECTING 12X1/4 (SUCTIONS) ×2 IMPLANT
YANKAUER SUCT BULB TIP NO VENT (SUCTIONS) ×2 IMPLANT

## 2017-05-08 NOTE — Op Note (Signed)
Date of Surgery: 05/08/2017  INDICATIONS: Cindy Cross is a 22 y.o.-year-old female who sustained a left ankle fracture; she was indicated for open reduction and internal fixation due to the displaced nature of the articular fracture and came to the operating room today for this procedure. The patient did consent to the procedure after discussion of the risks and benefits.  PREOPERATIVE DIAGNOSIS: left closed bimalleolar equivalent ankle fracture  POSTOPERATIVE DIAGNOSIS: Same.  PROCEDURE: 1. Open treatment of left ankle fracture with internal fixation. Lateral malleolar  2. Open reduction internal fixation of distal tibiofibular joint (syndesmosis fixation)  SURGEON: Jason P. Aundria Rud, M.D.  ASSIST: None.  ANESTHESIA:  general, with regional  TOURNIQUET TIME: 48 minutes  IV FLUIDS AND URINE: See anesthesia.  ESTIMATED BLOOD LOSS: 20 cc.  IMPLANTS: Arthrex one third tubular titanium plate with 3.5 mm cortical nonlocking screws. 1 titanium tight rope syndesmotic fixation device.  COMPLICATIONS: None.  DESCRIPTION OF PROCEDURE: The patient was brought to the operating room and placed supine on the operating table.  The patient had been signed prior to the procedure and this was documented. The patient had the anesthesia placed by the anesthesiologist.  A nonsterile tourniquet was placed on the upper thigh.  The prep verification and incision time-outs were performed to confirm that this was the correct patient, site, side and location. The patient had an SCD on the opposite lower extremity. The patient did receive antibiotics prior to the incision and was re-dosed during the procedure as needed at indicated intervals.  The patient had the lower extremity prepped and draped in the standard surgical fashion.  The extremity was exsanguinated using an esmarch bandage and the tourniquet was inflated to 300 mm Hg.   Incision was made over the distal fibula and the fracture was exposed and  reduced anatomically with a clamp. A lag screw was placed. I then applied a 1/3 tubular locking plate and secured it proximally and distally with non-locking screws. Bone quality was good. I used c-arm to confirm satisfactory reduction and fixation.     The syndesmosis was stressed using live fluoroscopy and found to be unstable.  We then moved to the syndesmotic fixation.  Utilizing the manufacturer's technique for tight rope syndesmotic fixation we drilled through the lateral cortex of the distal fibula and also through the plate.  The drill was passed through the medial cortex of the fibula and the lateral cortex of the tibia and finally the medial cortex of the tibia, passing through 4 cortices.  This was done approximately 3 cm proximal to the ankle joint.  This was done parallel to the ankle joint.  Care was taken to keep the heel freely floating and the ankle at neutral dorsiflexion to ensure adequate reduction of the distal tib-fib joint.  Next to the tight rope was passed through all 4 previously drilled cortices.  The Endobutton was flipped under fluoroscopic visualization.  Next we tightened the Endobutton against the medial cortex of the tibia while visualizing adequate reduction of the syndesmosis on a mortise AP x-ray.  This was again stressed and found to be stable.  Free suture ends were cut.  The wounds were irrigated, and closed with vicryl with routine closure for the skin. The wounds were injected with local anesthetic. Sterile gauze was applied followed by a posterior splint. She was awakened and returned to the PACU in stable and satisfactory condition. There were no complications.    POSTOPERATIVE PLAN: Cindy Cross will remain nonweightbearing on this leg  for approximately 6 weeks; Cindy Cross will return for suture removal in 2 weeks.  He will be immobilized in a short leg splint and then transitioned to a CAM walker at his first follow up appointment.  Cindy Cross will receive DVT  prophylaxis based on other medications, activity level, and risk ratio of bleeding to thrombosis.  Cindy Rued, MD [facility] [phone] 11:09 AM

## 2017-05-08 NOTE — Anesthesia Procedure Notes (Signed)
Procedure Name: LMA Insertion Date/Time: 05/08/2017 9:57 AM Performed by: Alvera Novel, CRNA Pre-anesthesia Checklist: Patient identified, Emergency Drugs available, Suction available and Patient being monitored Patient Re-evaluated:Patient Re-evaluated prior to induction Oxygen Delivery Method: Circle System Utilized Preoxygenation: Pre-oxygenation with 100% oxygen Induction Type: IV induction Ventilation: Mask ventilation without difficulty LMA: LMA inserted LMA Size: 4.0 Number of attempts: 1 Placement Confirmation: positive ETCO2 Tube secured with: Tape Dental Injury: Teeth and Oropharynx as per pre-operative assessment

## 2017-05-08 NOTE — Discharge Instructions (Signed)
Orthopedic discharge instructions: -No weightbearing allowed to the left lower extremity. -Keep your left leg elevated at all times with "toes above nose." -Do not get your splint wet. -Return to see Dr. Aundria Rud in 2 weeks for splint and suture removal. -For mild to moderate pain use Tylenol and/or Advil.  For breakthrough pain use oxycodone as directed. -For the prevention of blood clots use a 81 mg aspirin twice daily for 6 weeks.

## 2017-05-08 NOTE — Anesthesia Procedure Notes (Signed)
Anesthesia Regional Block: Adductor canal block   Pre-Anesthetic Checklist: ,, timeout performed, Correct Patient, Correct Site, Correct Laterality, Correct Procedure, Correct Position, site marked, Risks and benefits discussed,  Surgical consent,  Pre-op evaluation,  At surgeon's request and post-op pain management  Laterality: Left  Prep: chloraprep       Needles:  Injection technique: Single-shot  Needle Type: Echogenic Needle     Needle Length: 10cm  Needle Gauge: 21     Additional Needles:   Narrative:  Start time: 05/08/2017 9:16 AM End time: 05/08/2017 9:21 AM Injection made incrementally with aspirations every 5 mL.  Performed by: Personally  Anesthesiologist: Beryle Lathe, MD  Additional Notes: No pain on injection. No increased resistance to injection. Injection made in 5cc increments. Good needle visualization. Patient tolerated the procedure well.

## 2017-05-08 NOTE — Anesthesia Preprocedure Evaluation (Addendum)
Anesthesia Evaluation  Patient identified by MRN, date of birth, ID band Patient awake    Reviewed: Allergy & Precautions, NPO status , Patient's Chart, lab work & pertinent test results  Airway Mallampati: I  TM Distance: >3 FB Neck ROM: Full    Dental  (+) Dental Advisory Given, Teeth Intact   Pulmonary neg pulmonary ROS,    breath sounds clear to auscultation       Cardiovascular negative cardio ROS   Rhythm:Regular Rate:Normal  '16 TTE - EF 55% to 60%. Trivial MR and TR.   Neuro/Psych negative neurological ROS  negative psych ROS   GI/Hepatic negative GI ROS, Neg liver ROS,   Endo/Other  Obesity  Renal/GU negative Renal ROS  negative genitourinary   Musculoskeletal negative musculoskeletal ROS (+)   Abdominal (+) + obese,   Peds  Hematology negative hematology ROS (+)   Anesthesia Other Findings   Reproductive/Obstetrics                            Anesthesia Physical Anesthesia Plan  ASA: II  Anesthesia Plan: General   Post-op Pain Management:  Regional for Post-op pain   Induction: Intravenous  PONV Risk Score and Plan: 3 and Treatment may vary due to age or medical condition, Ondansetron, Dexamethasone and Midazolam  Airway Management Planned: LMA  Additional Equipment: None  Intra-op Plan:   Post-operative Plan: Extubation in OR  Informed Consent: I have reviewed the patients History and Physical, chart, labs and discussed the procedure including the risks, benefits and alternatives for the proposed anesthesia with the patient or authorized representative who has indicated his/her understanding and acceptance.   Dental advisory given  Plan Discussed with: CRNA and Anesthesiologist  Anesthesia Plan Comments:         Anesthesia Quick Evaluation

## 2017-05-08 NOTE — Brief Op Note (Signed)
05/08/2017  11:07 AM  PATIENT:  Hilton Cork  22 y.o. female  PRE-OPERATIVE DIAGNOSIS:  Left ankle fracture  POST-OPERATIVE DIAGNOSIS:  Left ankle fracture  PROCEDURE:  Procedure(s): OPEN REDUCTION INTERNAL FIXATION (ORIF) LEFT ANKLE FRACTURE (Left)  SURGEON:  Surgeon(s) and Role:    * Yolonda Kida, MD - Primary  PHYSICIAN ASSISTANT:   ASSISTANTS: none   ANESTHESIA:   regional and general  EBL:  20 mL   BLOOD ADMINISTERED:none  DRAINS: none   LOCAL MEDICATIONS USED:  NONE  SPECIMEN:  No Specimen  DISPOSITION OF SPECIMEN:  N/A  COUNTS:  YES  TOURNIQUET:   Total Tourniquet Time Documented: Thigh (Left) - 49 minutes Total: Thigh (Left) - 49 minutes   DICTATION: .Note written in EPIC  PLAN OF CARE: Discharge to home after PACU  PATIENT DISPOSITION:  PACU - hemodynamically stable.   Delay start of Pharmacological VTE agent (>24hrs) due to surgical blood loss or risk of bleeding: not applicable

## 2017-05-08 NOTE — Transfer of Care (Signed)
Immediate Anesthesia Transfer of Care Note  Patient: Cindy Cross  Procedure(s) Performed: OPEN REDUCTION INTERNAL FIXATION (ORIF) LEFT ANKLE FRACTURE (Left Ankle)  Patient Location: PACU  Anesthesia Type:General  Level of Consciousness: awake, alert  and oriented  Airway & Oxygen Therapy: Patient Spontanous Breathing and Patient connected to nasal cannula oxygen  Post-op Assessment: Report given to RN and Post -op Vital signs reviewed and stable  Post vital signs: Reviewed and stable  Last Vitals:  Vitals Value Taken Time  BP    Temp    Pulse 103 05/08/2017 11:13 AM  Resp 16 05/08/2017 11:13 AM  SpO2 100 % 05/08/2017 11:13 AM  Vitals shown include unvalidated device data.  Last Pain:  Vitals:   05/08/17 0843  TempSrc:   PainSc: 5          Complications: No apparent anesthesia complications

## 2017-05-08 NOTE — Anesthesia Procedure Notes (Signed)
Anesthesia Regional Block: Popliteal block   Pre-Anesthetic Checklist: ,, timeout performed, Correct Patient, Correct Site, Correct Laterality, Correct Procedure, Correct Position, site marked, Risks and benefits discussed,  Surgical consent,  Pre-op evaluation,  At surgeon's request and post-op pain management  Laterality: Left  Prep: chloraprep       Needles:  Injection technique: Single-shot  Needle Type: Echogenic Needle     Needle Length: 10cm  Needle Gauge: 21     Additional Needles:   Narrative:  Start time: 05/08/2017 9:22 AM End time: 05/08/2017 9:25 AM Injection made incrementally with aspirations every 5 mL.  Performed by: Personally  Anesthesiologist: Beryle Lathe, MD  Additional Notes: No pain on injection. No increased resistance to injection. Injection made in 5cc increments. Good needle visualization. Patient tolerated the procedure well.

## 2017-05-08 NOTE — H&P (Signed)
ORTHOPAEDIC H and P  REQUESTING PHYSICIAN: Yolonda Kida, MD  PCP:  [name], NP  Chief Complaint: Right ankle fracture  HPI: Cindy Cross is a 22 y.o. female who complains of left ankle pain following a fall.  I evaluated her in my office last week where she was assessed to have a unstable left ankle fracture.  Our recommendation was for left ankle open reduction and internal fixation.  She presents today for that surgery.  She has no new complaints at this time.  Past Medical History:  Diagnosis Date  . Fracture    left fibula  . Murmur, cardiac   . Obesity    History reviewed. No pertinent surgical history. Social History   Socioeconomic History  . Marital status: Single    Spouse name: Not on file  . Number of children: Not on file  . Years of education: Not on file  . Highest education level: Not on file  Occupational History  . Not on file  Social Needs  . Financial resource strain: Not on file  . Food insecurity:    Worry: Not on file    Inability: Not on file  . Transportation needs:    Medical: Not on file    Non-medical: Not on file  Tobacco Use  . Smoking status: Never Smoker  . Smokeless tobacco: Never Used  Substance and Sexual Activity  . Alcohol use: Yes    Alcohol/week: 0.0 oz    Comment: occasional  . Drug use: No  . Sexual activity: Not on file  Lifestyle  . Physical activity:    Days per week: Not on file    Minutes per session: Not on file  . Stress: Not on file  Relationships  . Social connections:    Talks on phone: Not on file    Gets together: Not on file    Attends religious service: Not on file    Active member of club or organization: Not on file    Attends meetings of clubs or organizations: Not on file    Relationship status: Not on file  Other Topics Concern  . Not on file  Social History Narrative  . Not on file   Family History  Problem Relation Age of Onset  . Cancer Maternal Aunt   .  Hypertension Maternal Grandmother   . Stroke Maternal Grandmother   . Heart disease Maternal Grandmother   . Diabetes Maternal Grandfather   . Hypertension Maternal Grandfather   . Diabetes Mother   . Asthma Mother    No Known Allergies Prior to Admission medications   Medication Sig Start Date End Date Taking? Authorizing Provider  etonogestrel (NEXPLANON) 68 MG IMPL implant 1 each by Subdermal route once.   Yes [provider]  ibuprofen (ADVIL,MOTRIN) 200 MG tablet Take 800 mg by mouth every 6 (six) hours as needed for headache or moderate pain.   Yes [provider]   No results found.  Positive ROS: All other systems have been reviewed and were otherwise negative with the exception of those mentioned in the HPI and as above.  Physical Exam: General: Alert, no acute distress Cardiovascular: No pedal edema Respiratory: No cyanosis, no use of accessory musculature GI: No organomegaly, abdomen is soft and non-tender Skin: No lesions in the area of chief complaint Neurologic: Sensation intact distally Psychiatric: Patient is competent for consent with normal mood and affect Lymphatic: No axillary or cervical lymphadenopathy   Assessment: Left ankle bimalleolar  equivalent fracture,  Closed.  Plan: -Plan for open reduction and internal fixation of left ankle today.  We again reviewed the risks, benefits, and indications of this procedure at length.  All questions were solicited and answered to her and her mother satisfaction.  She has provided informed consent. -We will plan for discharge home from PACU outpatient follow-up.    Yolonda Kida, MD Cell [phone]    05/08/2017 9:15 AM

## 2017-05-08 NOTE — Anesthesia Postprocedure Evaluation (Signed)
Anesthesia Post Note  Patient: Cindy Cross  Procedure(s) Performed: OPEN REDUCTION INTERNAL FIXATION (ORIF) LEFT ANKLE FRACTURE (Left Ankle)     Patient location during evaluation: PACU Anesthesia Type: General Level of consciousness: awake and alert Pain management: pain level controlled Vital Signs Assessment: post-procedure vital signs reviewed and stable Respiratory status: spontaneous breathing, nonlabored ventilation and respiratory function stable Cardiovascular status: blood pressure returned to baseline and stable Postop Assessment: no apparent nausea or vomiting Anesthetic complications: no    Last Vitals:  Vitals:   05/08/17 1127 05/08/17 1128  BP:  128/68  Pulse: 72 67  Resp: (!) 9 (!) 7  Temp:    SpO2: 100% 99%    Last Pain:  Vitals:   05/08/17 1145  TempSrc:   PainSc: Asleep                 Beryle Lathe

## 2017-05-09 ENCOUNTER — Encounter (HOSPITAL_COMMUNITY): Payer: Self-pay | Admitting: Orthopedic Surgery
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
## 2019-03-01 ENCOUNTER — Emergency Department (HOSPITAL_COMMUNITY)
Admission: EM | Admit: 2019-03-01 | Discharge: 2019-03-01 | Disposition: A | Discharge status: Home / Self Care | Payer: No Typology Code available for payment source | Attending: Emergency Medicine | Admitting: Emergency Medicine

## 2019-03-01 ENCOUNTER — Encounter (HOSPITAL_COMMUNITY): Payer: Self-pay | Admitting: Emergency Medicine

## 2019-03-01 ENCOUNTER — Other Ambulatory Visit: Payer: Self-pay

## 2019-03-01 DIAGNOSIS — Z79899 Other long term (current) drug therapy: Secondary | ICD-10-CM | POA: Diagnosis not present

## 2019-03-01 DIAGNOSIS — S0990XA Unspecified injury of head, initial encounter: Secondary | ICD-10-CM | POA: Insufficient documentation

## 2019-03-01 DIAGNOSIS — Y929 Unspecified place or not applicable: Secondary | ICD-10-CM | POA: Diagnosis not present

## 2019-03-01 DIAGNOSIS — W228XXA Striking against or struck by other objects, initial encounter: Secondary | ICD-10-CM | POA: Insufficient documentation

## 2019-03-01 DIAGNOSIS — Y9389 Activity, other specified: Secondary | ICD-10-CM | POA: Diagnosis not present

## 2019-03-01 DIAGNOSIS — Y999 Unspecified external cause status: Secondary | ICD-10-CM | POA: Insufficient documentation

## 2019-03-01 DIAGNOSIS — Z20822 Contact with and (suspected) exposure to covid-19: Secondary | ICD-10-CM | POA: Insufficient documentation
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
## 2019-03-01 NOTE — Discharge Instructions (Signed)
Take tylenol or ibuprofen as needed for your pain.  Rest and avoid any activity that may cause head injury.  Check My Chart for result of your COVID-19 test.

## 2019-03-01 NOTE — ED Provider Notes (Signed)
[facility]-EMERGENCY DEPT Provider Note   CSN: [id number] Arrival date & time: 03/01/19  1816     History Chief Complaint  Patient presents with  . Headache  . Head Injury    Cindy Cross is a 24 y.o. female.  The history is provided by the patient. No language interpreter was used.  Headache Head Injury Associated symptoms: headache      24 year old female presenting for evaluation of head injury.  Patient report prior to arrival, she was in the process of milking a cow when the suctioning milking device from the now nipples detached and struck her in the forehead.  Incident happened approximately 2 hours ago.  She endorsed swelling and tenderness to her forehead.  Pain is sharp, 3 out of 10, nonradiating without any associated lightheadedness dizziness nausea vomiting confusion light or sound sensitivity or neck pain.  She is here requesting to evaluate for potential concussion.  She also requests to be tested for COVID-19.  She denies any recent sick contact.  She denies any Covid symptoms such as no fever chills.  No sneezing coughing loss of taste or smell chest pain or shortness of breath.  No specific treatment tried.  Patient is really not pregnant, has implant.  Past Medical History:  Diagnosis Date  . Fracture    left fibula  . Murmur, cardiac   . Obesity     Patient Active Problem List   Diagnosis Date Noted  . Ankle fracture, left 05/08/2017  . Heart palpitations 06/06/2014    Past Surgical History:  Procedure Laterality Date  . ORIF ANKLE FRACTURE Left 05/08/2017   Procedure: OPEN REDUCTION INTERNAL FIXATION (ORIF) LEFT ANKLE FRACTURE;  Surgeon: Yolonda Kida, MD;  Location: [facility] OR;  Service: Orthopedics;  Laterality: Left;     OB History   No obstetric history on file.     Family History  Problem Relation Age of Onset  . Cancer Maternal Aunt   . Hypertension Maternal Grandmother   . Stroke Maternal Grandmother   . Heart  disease Maternal Grandmother   . Diabetes Maternal Grandfather   . Hypertension Maternal Grandfather   . Diabetes Mother   . Asthma Mother     Social History   Tobacco Use  . Smoking status: Never Smoker  . Smokeless tobacco: Never Used  Substance Use Topics  . Alcohol use: Yes    Alcohol/week: 0.0 standard drinks    Comment: occasional  . Drug use: No    Home Medications Prior to Admission medications   Medication Sig Start Date End Date Taking? Authorizing Provider  etonogestrel (NEXPLANON) 68 MG IMPL implant 1 each by Subdermal route once.    [provider]  ibuprofen (ADVIL,MOTRIN) 200 MG tablet Take 800 mg by mouth every 6 (six) hours as needed for headache or moderate pain.    [provider]  ondansetron (ZOFRAN ODT) 4 MG disintegrating tablet Take 1 tablet (4 mg total) by mouth every 8 (eight) hours as needed for nausea or vomiting. 05/08/17   Yolonda Kida, MD    Allergies    Patient has no known allergies.  Review of Systems   Review of Systems  Neurological: Positive for headaches.  All other systems reviewed and are negative.   Physical Exam Updated Vital Signs BP 138/86   Pulse 69   Temp 98.8 F (37.1 C) (Oral)   Resp 18   Ht 5\' 3"  (1.6 m)   SpO2 98%   BMI 37.20  kg/m   Physical Exam Vitals and nursing note reviewed.  Constitutional:      General: She is not in acute distress.    Appearance: She is well-developed.  HENT:     Head: Normocephalic.     Comments: Edema noted to mid forehead with tenderness to palpation but no crepitus.  No midface tenderness. Eyes:     General: No visual field deficit.    Extraocular Movements: Extraocular movements intact.     Conjunctiva/sclera: Conjunctivae normal.     Pupils: Pupils are equal, round, and reactive to light.  Neck:     Comments: No cervical spine tenderness Musculoskeletal:     Cervical back: Normal range of motion and neck supple.  Skin:    Findings: No rash.    Neurological:     Mental Status: She is alert and oriented to person, place, and time.     GCS: GCS eye subscore is 4. GCS verbal subscore is 5. GCS motor subscore is 6.     Cranial Nerves: No cranial nerve deficit or dysarthria.     Sensory: No sensory deficit.     Motor: No weakness.  Psychiatric:        Mood and Affect: Mood normal.     ED Results / Procedures / Treatments   Labs (all labs ordered are listed, but only abnormal results are displayed) Labs Reviewed  SARS CORONAVIRUS 2 (TAT 6-24 HRS)    EKG None  Radiology No results found.  Procedures Procedures (including critical care time)  Medications Ordered in ED Medications - No data to display  ED Course  I have reviewed the triage vital signs and the nursing notes.  Pertinent labs & imaging results that were available during my care of the patient were reviewed by me and considered in my medical decision making (see chart for details).    MDM Rules/Calculators/A&P                      BP 138/86   Pulse 69   Temp 98.8 F (37.1 C) (Oral)   Resp 18   Ht 5\' 3"  (1.6 m)   SpO2 98%   BMI 37.20 kg/m   Final Clinical Impression(s) / ED Diagnoses Final diagnoses:  Minor head injury, initial encounter  Encounter for screening laboratory testing for COVID-19 virus in asymptomatic patient    Rx / DC Orders ED Discharge Orders    None     8:26 PM Patient here for mild head injury.  She was concerned about potential concussion.  She does not exhibit any concussive symptoms.  Reassurance.  Explained red flags to return.  She also request for a COVID-19 test.  She does not have any recent COVID-19 exposure nor does she have any COVID-19 symptoms. Test ordered. patient recommended to check my chart for result.   Cindy Cross was evaluated in Emergency Department on 03/01/2019 for the symptoms described in the history of present illness. She was evaluated in the context of the global COVID-19 pandemic, which  necessitated consideration that the patient might be at risk for infection with the SARS-CoV-2 virus that causes COVID-19. Institutional protocols and algorithms that pertain to the evaluation of patients at risk for COVID-19 are in a state of rapid change based on information released by regulatory bodies including the CDC and federal and state organizations. These policies and algorithms were followed during the patient's care in the ED.    03/03/2019, PA-C 03/01/19 2028  Davonna Belling, MD 03/01/19 [phone]

## 2019-03-02 LAB — SARS CORONAVIRUS 2 (TAT 6-24 HRS): SARS Coronavirus 2: NEGATIVE

## 2019-09-10 ENCOUNTER — Ambulatory Visit
Admission: EM | Admit: 2019-09-10 | Discharge: 2019-09-10 | Disposition: A | Discharge status: Home / Self Care | Payer: PRIVATE HEALTH INSURANCE | Attending: Emergency Medicine | Admitting: Emergency Medicine

## 2019-09-10 ENCOUNTER — Other Ambulatory Visit: Payer: Self-pay

## 2019-09-10 DIAGNOSIS — Z1152 Encounter for screening for COVID-19: Secondary | ICD-10-CM | POA: Diagnosis not present

## 2019-09-10 NOTE — ED Triage Notes (Signed)
covid test no s/s  °

## 2019-09-12 LAB — NOVEL CORONAVIRUS, NAA: SARS-CoV-2, NAA: NOT DETECTED

## 2019-09-12 LAB — SARS-COV-2, NAA 2 DAY TAT

## 2023-04-29 ENCOUNTER — Ambulatory Visit (HOSPITAL_COMMUNITY)
Admission: EM | Admit: 2023-04-29 | Discharge: 2023-04-30 | Disposition: A | Discharge status: Other Acute Inpatient Hospital | Attending: Psychiatry | Admitting: Psychiatry

## 2023-04-29 DIAGNOSIS — F419 Anxiety disorder, unspecified: Secondary | ICD-10-CM | POA: Diagnosis not present

## 2023-04-29 DIAGNOSIS — Z9151 Personal history of suicidal behavior: Secondary | ICD-10-CM | POA: Diagnosis not present

## 2023-04-29 DIAGNOSIS — Z3202 Encounter for pregnancy test, result negative: Secondary | ICD-10-CM | POA: Diagnosis not present

## 2023-04-29 DIAGNOSIS — Z79899 Other long term (current) drug therapy: Secondary | ICD-10-CM | POA: Insufficient documentation

## 2023-04-29 DIAGNOSIS — Z91141 Patient's other noncompliance with medication regimen due to financial hardship: Secondary | ICD-10-CM | POA: Insufficient documentation

## 2023-04-29 DIAGNOSIS — R45851 Suicidal ideations: Secondary | ICD-10-CM | POA: Insufficient documentation

## 2023-04-29 DIAGNOSIS — Z5986 Financial insecurity: Secondary | ICD-10-CM | POA: Diagnosis not present

## 2023-04-29 DIAGNOSIS — F332 Major depressive disorder, recurrent severe without psychotic features: Secondary | ICD-10-CM | POA: Insufficient documentation

## 2023-04-29 LAB — COMPREHENSIVE METABOLIC PANEL WITH GFR
ALT: 21 U/L (ref 0–44)
AST: 20 U/L (ref 15–41)
Albumin: 3.8 g/dL (ref 3.5–5.0)
Alkaline Phosphatase: 41 U/L (ref 38–126)
Anion gap: 12 (ref 5–15)
BUN: 12 mg/dL (ref 6–20)
CO2: 23 mmol/L (ref 22–32)
Calcium: 8.9 mg/dL (ref 8.9–10.3)
Chloride: 102 mmol/L (ref 98–111)
Creatinine, Ser: 0.84 mg/dL (ref 0.44–1.00)
GFR, Estimated: >60 mL/min (ref >60)
Glucose, Bld: 86 mg/dL (ref 70–99)
Potassium: 3.5 mmol/L (ref 3.5–5.1)
Sodium: 137 mmol/L (ref 135–145)
Total Bilirubin: 0.5 mg/dL (ref 0.0–1.2)
Total Protein: 7 g/dL (ref 6.5–8.1)

## 2023-04-29 LAB — POCT URINE DRUG SCREEN - MANUAL ENTRY (I-SCREEN)
POC Amphetamine UR: NOT DETECTED
POC Buprenorphine (BUP): NOT DETECTED
POC Cocaine UR: NOT DETECTED
POC Marijuana UR: NOT DETECTED
POC Methadone UR: NOT DETECTED
POC Methamphetamine UR: NOT DETECTED
POC Morphine: NOT DETECTED
POC Oxazepam (BZO): NOT DETECTED
POC Oxycodone UR: NOT DETECTED
POC Secobarbital (BAR): NOT DETECTED

## 2023-04-29 LAB — CBC WITH DIFFERENTIAL/PLATELET
Abs Immature Granulocytes: 0.02 10*3/uL (ref 0.00–0.07)
Basophils Absolute: 0.1 10*3/uL (ref 0.0–0.1)
Basophils Relative: 1 %
Eosinophils Absolute: 0.2 10*3/uL (ref 0.0–0.5)
Eosinophils Relative: 3 %
HCT: 40.3 % (ref 36.0–46.0)
Hemoglobin: 13 g/dL (ref 12.0–15.0)
Immature Granulocytes: 0 %
Lymphocytes Relative: 43 %
Lymphs Abs: 3 10*3/uL (ref 0.7–4.0)
MCH: 29.3 pg (ref 26.0–34.0)
MCHC: 32.3 g/dL (ref 30.0–36.0)
MCV: 90.8 fL (ref 80.0–100.0)
Monocytes Absolute: 0.7 10*3/uL (ref 0.1–1.0)
Monocytes Relative: 9 %
Neutro Abs: 3 10*3/uL (ref 1.7–7.7)
Neutrophils Relative %: 44 %
Platelets: 278 10*3/uL (ref 150–400)
RBC: 4.44 MIL/uL (ref 3.87–5.11)
RDW: 13.6 % (ref 11.5–15.5)
WBC: 6.9 10*3/uL (ref 4.0–10.5)
nRBC: 0 % (ref 0.0–0.2)

## 2023-04-29 LAB — LIPID PANEL
Cholesterol: 161 mg/dL (ref 0–200)
HDL: 73 mg/dL (ref >40)
LDL Cholesterol: 72 mg/dL (ref 0–99)
Total CHOL/HDL Ratio: 2.2 ratio
Triglycerides: 78 mg/dL (ref <150)
VLDL: 16 mg/dL (ref 0–40)

## 2023-04-29 LAB — POCT PREGNANCY, URINE: Preg Test, Ur: NEGATIVE

## 2023-04-29 LAB — HEMOGLOBIN A1C
Hgb A1c MFr Bld: 5.3 % (ref 4.8–5.6)
Mean Plasma Glucose: 105.41 mg/dL

## 2023-04-29 LAB — ETHANOL: Alcohol, Ethyl (B): <15 mg/dL (ref <15)

## 2023-04-29 LAB — TSH: TSH: 2.121 u[IU]/mL (ref 0.350–4.500)

## 2023-04-29 MED ORDER — GABAPENTIN 100 MG PO CAPS: 100.0000 mg | ORAL_CAPSULE | Freq: Three times a day (TID) | ORAL | Status: DC

## 2023-04-29 MED ORDER — LORAZEPAM 2 MG/ML IJ SOLN: 2.0000 mg | Freq: Three times a day (TID) | INTRAMUSCULAR | Status: DC | PRN

## 2023-04-29 MED ORDER — DIPHENHYDRAMINE HCL 50 MG/ML IJ SOLN: 50.0000 mg | Freq: Three times a day (TID) | INTRAMUSCULAR | Status: DC | PRN

## 2023-04-29 MED ORDER — ALUM & MAG HYDROXIDE-SIMETH 200-200-20 MG/5ML PO SUSP: 30.0000 mL | ORAL | Status: DC | PRN

## 2023-04-29 MED ORDER — BUPROPION HCL ER (XL) 150 MG PO TB24
150.0000 mg | ORAL_TABLET | Freq: Every day | ORAL | Status: DC
Start: 2023-04-30 — End: 2023-04-30

## 2023-04-29 MED ORDER — HALOPERIDOL LACTATE 5 MG/ML IJ SOLN: 10.0000 mg | Freq: Three times a day (TID) | INTRAMUSCULAR | Status: DC | PRN

## 2023-04-29 MED ORDER — DIPHENHYDRAMINE HCL 50 MG PO CAPS: 50.0000 mg | ORAL_CAPSULE | Freq: Three times a day (TID) | ORAL | Status: DC | PRN

## 2023-04-29 MED ORDER — HALOPERIDOL 5 MG PO TABS: 5.0000 mg | ORAL_TABLET | Freq: Three times a day (TID) | ORAL | Status: DC | PRN

## 2023-04-29 MED ORDER — HALOPERIDOL LACTATE 5 MG/ML IJ SOLN: 5.0000 mg | Freq: Three times a day (TID) | INTRAMUSCULAR | Status: DC | PRN

## 2023-04-29 MED ORDER — MAGNESIUM HYDROXIDE 400 MG/5ML PO SUSP: 30.0000 mL | Freq: Every day | ORAL | Status: DC | PRN

## 2023-04-29 MED ORDER — GABAPENTIN 100 MG PO CAPS
100.0000 mg | ORAL_CAPSULE | Freq: Once | ORAL | Status: AC
  Administered 2023-04-29: 100 mg via ORAL
  Filled 2023-04-29: qty 1

## 2023-04-29 MED ORDER — TRAZODONE HCL 50 MG PO TABS
50.0000 mg | ORAL_TABLET | Freq: Every evening | ORAL | Status: DC | PRN
  Administered 2023-04-29: 50 mg via ORAL
  Filled 2023-04-29: qty 1

## 2023-04-29 MED ORDER — ACETAMINOPHEN 325 MG PO TABS: 650.0000 mg | ORAL_TABLET | Freq: Four times a day (QID) | ORAL | Status: DC | PRN
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
## 2023-04-29 NOTE — ED Notes (Signed)
 Patient appears to be sleeping, respirations present, no distress.

## 2023-04-29 NOTE — Discharge Instructions (Addendum)
 Patient transferring to [facility] for inpatient treatment
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
## 2023-04-29 NOTE — ED Notes (Signed)
 Patient is cooperative and compliant with staff directions and she denies any homicidal or suicidal ideations as well as any hallucinations. Her affect brightens upon interaction and she is social on approach with staff. She describes her mood as "alright" and states that her last bowel movement was 04/29/23. Will continue to provide support, redirection, and further interventions as needed.

## 2023-04-30 ENCOUNTER — Encounter (HOSPITAL_COMMUNITY): Payer: Self-pay | Admitting: Psychiatry

## 2023-04-30 ENCOUNTER — Inpatient Hospital Stay (HOSPITAL_COMMUNITY)
Admission: AD | Admit: 2023-04-30 | Discharge: 2023-05-03 | DRG: 885 | Disposition: A | Discharge status: Home / Self Care | Source: Intra-hospital | Attending: Psychiatry | Admitting: Psychiatry

## 2023-04-30 ENCOUNTER — Other Ambulatory Visit: Payer: Self-pay

## 2023-04-30 DIAGNOSIS — Z8249 Family history of ischemic heart disease and other diseases of the circulatory system: Secondary | ICD-10-CM

## 2023-04-30 DIAGNOSIS — Z79899 Other long term (current) drug therapy: Secondary | ICD-10-CM

## 2023-04-30 DIAGNOSIS — Z5986 Financial insecurity: Secondary | ICD-10-CM | POA: Diagnosis not present

## 2023-04-30 DIAGNOSIS — F332 Major depressive disorder, recurrent severe without psychotic features: Principal | ICD-10-CM | POA: Diagnosis present

## 2023-04-30 DIAGNOSIS — F410 Panic disorder [episodic paroxysmal anxiety] without agoraphobia: Secondary | ICD-10-CM | POA: Diagnosis present

## 2023-04-30 DIAGNOSIS — R45851 Suicidal ideations: Secondary | ICD-10-CM | POA: Diagnosis present

## 2023-04-30 DIAGNOSIS — Z823 Family history of stroke: Secondary | ICD-10-CM | POA: Diagnosis not present

## 2023-04-30 DIAGNOSIS — Z604 Social exclusion and rejection: Secondary | ICD-10-CM | POA: Diagnosis present

## 2023-04-30 DIAGNOSIS — Z825 Family history of asthma and other chronic lower respiratory diseases: Secondary | ICD-10-CM

## 2023-04-30 DIAGNOSIS — Z9152 Personal history of nonsuicidal self-harm: Secondary | ICD-10-CM

## 2023-04-30 DIAGNOSIS — F17290 Nicotine dependence, other tobacco product, uncomplicated: Secondary | ICD-10-CM | POA: Diagnosis present

## 2023-04-30 DIAGNOSIS — Z5941 Food insecurity: Secondary | ICD-10-CM

## 2023-04-30 DIAGNOSIS — Z833 Family history of diabetes mellitus: Secondary | ICD-10-CM

## 2023-04-30 DIAGNOSIS — F411 Generalized anxiety disorder: Secondary | ICD-10-CM | POA: Diagnosis present

## 2023-04-30 DIAGNOSIS — Z9151 Personal history of suicidal behavior: Secondary | ICD-10-CM

## 2023-04-30 DIAGNOSIS — Z6841 Body Mass Index (BMI) 40.0 and over, adult: Secondary | ICD-10-CM

## 2023-04-30 DIAGNOSIS — E669 Obesity, unspecified: Secondary | ICD-10-CM | POA: Diagnosis present

## 2023-04-30 MED ORDER — DIPHENHYDRAMINE HCL 25 MG PO CAPS: 50.0000 mg | ORAL_CAPSULE | Freq: Three times a day (TID) | ORAL | Status: DC | PRN

## 2023-04-30 MED ORDER — HALOPERIDOL 5 MG PO TABS: 5.0000 mg | ORAL_TABLET | Freq: Three times a day (TID) | ORAL | Status: DC | PRN

## 2023-04-30 MED ORDER — ACETAMINOPHEN 325 MG PO TABS: 650.0000 mg | ORAL_TABLET | Freq: Four times a day (QID) | ORAL | Status: DC | PRN

## 2023-04-30 MED ORDER — HALOPERIDOL LACTATE 5 MG/ML IJ SOLN: 10.0000 mg | Freq: Three times a day (TID) | INTRAMUSCULAR | Status: DC | PRN

## 2023-04-30 MED ORDER — DIPHENHYDRAMINE HCL 50 MG/ML IJ SOLN: 50.0000 mg | Freq: Three times a day (TID) | INTRAMUSCULAR | Status: DC | PRN

## 2023-04-30 MED ORDER — MAGNESIUM HYDROXIDE 400 MG/5ML PO SUSP: 30.0000 mL | Freq: Every day | ORAL | Status: DC | PRN

## 2023-04-30 MED ORDER — HALOPERIDOL LACTATE 5 MG/ML IJ SOLN: 5.0000 mg | Freq: Three times a day (TID) | INTRAMUSCULAR | Status: DC | PRN

## 2023-04-30 MED ORDER — LORAZEPAM 2 MG/ML IJ SOLN: 2.0000 mg | Freq: Three times a day (TID) | INTRAMUSCULAR | Status: DC | PRN

## 2023-04-30 MED ORDER — GABAPENTIN 100 MG PO CAPS
100.0000 mg | ORAL_CAPSULE | Freq: Three times a day (TID) | ORAL | Status: DC
  Administered 2023-04-30 – 2023-05-03 (×10): 100 mg via ORAL
  Filled 2023-04-30 (×20): qty 1

## 2023-04-30 MED ORDER — BUPROPION HCL ER (XL) 150 MG PO TB24
150.0000 mg | ORAL_TABLET | Freq: Every day | ORAL | Status: DC
  Administered 2023-04-30 – 2023-05-03 (×4): 150 mg via ORAL
  Filled 2023-04-30 (×7): qty 1

## 2023-04-30 MED ORDER — ALUM & MAG HYDROXIDE-SIMETH 200-200-20 MG/5ML PO SUSP: 30.0000 mL | ORAL | Status: DC | PRN

## 2023-04-30 MED ORDER — TRAZODONE HCL 50 MG PO TABS: 50.0000 mg | ORAL_TABLET | Freq: Every evening | ORAL | Status: DC | PRN

## 2023-04-30 NOTE — Plan of Care (Signed)

## 2023-04-30 NOTE — Tx Team (Signed)
 Initial Treatment Plan 04/30/2023 1:26 AM [name] ZOX:[id number]    PATIENT STRESSORS: Financial difficulties   Loss of housing   Traumatic event     PATIENT STRENGTHS: Average or above average intelligence  Communication skills  Physical Health  Supportive family/friends    PATIENT IDENTIFIED PROBLEMS: Depression  Suicidal Ideation            "Get will to live back"       DISCHARGE CRITERIA:  Adequate post-discharge living arrangements Improved stabilization in mood, thinking, and/or behavior Motivation to continue treatment in a less acute level of care Need for constant or close observation no longer present Verbal commitment to aftercare and medication compliance  PRELIMINARY DISCHARGE PLAN: Attend aftercare/continuing care group Outpatient therapy Placement in alternative living arrangements  PATIENT/FAMILY INVOLVEMENT: This treatment plan has been presented to and reviewed with the patient, Cindy Cross.  The patient and family have been given the opportunity to ask questions and make suggestions.  Delaney Fearing, RN 04/30/2023, 1:26 AM

## 2023-04-30 NOTE — Progress Notes (Signed)
 Adult Psychoeducational Group Note  Date:  04/30/2023 Time:  8:47 PM  Group Topic/Focus:  Wrap-Up Group:   The focus of this group is to help patients review their daily goal of treatment and discuss progress on daily workbooks.  Participation Level:  Active  Participation Quality:  Appropriate  Affect:  Appropriate  Cognitive:  Appropriate  Insight: Appropriate  Engagement in Group:  Engaged  Modes of Intervention:  Discussion  Additional Comments:  Pt stated her day was good.  Pt goal for the day was just make it through the day. Pt met goal.  Aquilla Bayley 04/30/2023, 8:47 PM

## 2023-04-30 NOTE — Plan of Care (Signed)
   Problem: Education: Goal: Knowledge of [place] [facility] Education information/materials will improve Outcome: Progressing

## 2023-04-30 NOTE — Plan of Care (Signed)
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
## 2023-04-30 NOTE — Progress Notes (Signed)
   04/30/23 0530  15 Minute Checks  Location Bedroom  Visual Appearance Calm  Behavior Sleeping  Sleep (Behavioral Health Patients Only)  Calculate sleep? (Click Yes once per 24 hr at 0600 safety check) Yes  Documented sleep last 24 hours 3.75

## 2023-04-30 NOTE — Progress Notes (Addendum)
 D: Patient is alert, oriented, pleasant, and cooperative. Denies SI, HI, AVH, and verbally contracts for safety. Patient reports she slept good last night without sleeping medication. Patient reports her appetite as fair, energy level as normal, and concentration as good. Patient rates her depression 5/10, hopelessness 3/10, and anxiety 6/10. Patient denies physical symptoms/pain.    A: Scheduled medications administered per MD order. Support provided. Patient educated on safety on the unit and medications. Routine safety checks every 15 minutes. Patient stated understanding to tell nurse about any new physical symptoms. Patient understands to tell staff of any needs.     R: No adverse drug reactions noted. Patient remains safe at this time and will continue to monitor.    04/30/23 0900  Psych Admission Type (Psych Patients Only)  Admission Status Voluntary  Psychosocial Assessment  Patient Complaints None  Eye Contact Fair  Facial Expression Animated  Affect Appropriate to circumstance  Speech Logical/coherent  Interaction Forwards little  Motor Activity Other (Comment) (WNL)  Appearance/Hygiene Unremarkable  Behavior Characteristics Appropriate to situation;Cooperative  Mood Depressed;Pleasant  Thought Process  Coherency WDL  Content WDL  Delusions None reported or observed  Perception WDL  Hallucination None reported or observed  Judgment Poor  Confusion None  Danger to Self  Current suicidal ideation? Denies  Agreement Not to Harm Self Yes  Description of Agreement verbal  Danger to Others  Danger to Others None reported or observed

## 2023-04-30 NOTE — Group Note (Signed)
 Date:  04/30/2023 Time:  8:50 AM  Group Topic/Focus:  Goals Group:   The focus of this group is to help patients establish daily goals to achieve during treatment and discuss how the patient can incorporate goal setting into their daily lives to aide in recovery.    Participation Level:  Did Not Attend  Cindy Cross 04/30/2023, 8:50 AM

## 2023-04-30 NOTE — Group Note (Signed)
 Date:  04/30/2023 Time:  2:16 PM  Group Topic/Focus:  Developing a Wellness Toolbox:   The focus of this group is to help patients develop a "wellness toolbox" with skills and strategies to promote recovery upon discharge.    Participation Level:  Active  Participation Quality:  Appropriate  Affect:  Appropriate   Ellan Gunner 04/30/2023, 2:16 PM

## 2023-04-30 NOTE — Progress Notes (Signed)
   04/30/23 2100  Psych Admission Type (Psych Patients Only)  Admission Status Voluntary  Psychosocial Assessment  Patient Complaints None  Eye Contact Fair  Facial Expression Animated  Affect Appropriate to circumstance  Speech Logical/coherent  Interaction Assertive  Motor Activity Other (Comment) (wnl)  Appearance/Hygiene Unremarkable  Behavior Characteristics Appropriate to situation;Cooperative;Calm  Mood Pleasant  Thought Process  Coherency WDL  Content WDL  Delusions None reported or observed  Perception WDL  Hallucination None reported or observed  Judgment Poor  Confusion None  Danger to Self  Current suicidal ideation? Denies  Description of Suicide Plan none  Agreement Not to Harm Self Yes  Description of Agreement verbal  Danger to Others  Danger to Others None reported or observed

## 2023-04-30 NOTE — H&P (Signed)
 Psychiatric Admission Assessment Adult  Patient Identification: Zniya Tribett MRN:  [id number] Date of Evaluation:  04/30/2023 Chief Complaint:  MDD (major depressive disorder), recurrent episode, severe (HCC) [F33.2] Principal Diagnosis: MDD (major depressive disorder), recurrent episode, severe (HCC) Diagnosis:  Principal Problem:   MDD (major depressive disorder), recurrent episode, severe (HCC) Active Problems:   GAD (generalized anxiety disorder)  History of Present Illness:  Cindy Cross is a 28 yr old female who presented on  PPHx is significant for Depression and Anxiety, 2 Suicide Attempts (last- OD 04/11/2023), a remote history of Self Injurious Behavior (Cutting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  When asked what led to her hospitalization she reports that it was the confluence of multiple stressors-new job, moving, and financial issues.  She reports that all these came together and she had a panic attack.  She reports that due to this she began to have SI with a plan and so called the crisis hotline and was told to come to the hospital for evaluation.  She reports that she did take an overdose on April 8 of her doxepin and so had stopped taking all of her medications due to concern over that which helped lead to her panic attack.  She reports a past psychiatric history significant for depression and anxiety.  She reports a history significant for 2 suicide attempts- last OD 04/11/2023.  She reports a remote history of self-injurious behavior- cutting last in high school.  She reports no prior psychiatric hospitalizations.  She does report attending the PHP and IOP program at [facility] September 2024.  She reports past medical history significant for heart murmur.  She reports past surgical history significant for left ankle surgery.  She reports no history of head trauma.  She reports no history of seizures.  She reports NKDA.  She reports is currently  moving to a townhouse with her coworker.  She reports she currently works at [employer].  She reports she has a bachelor's in Scientist, clinical (histocompatibility and immunogenetics).  She reports she drinks a moderate amount of alcohol-glass of wine with dinner.  She reports she does vape contain.  She reports no illicit substance use.  She reports no current legal issues.  She reports no access to firearms.  When asked how she thought her medications were working she reports that she feels like they are helpful.  She reports it was these mounting stressors that caused her to stop her medications which further worsened her symptoms.  Discussed with her that we could continue with her Wellbutrin and gabapentin and she was agreeable with this.  She reports no other concerns at present.   Associated Signs/Symptoms: Depression Symptoms:  depressed mood, anhedonia, fatigue, feelings of worthlessness/guilt, hopelessness, impaired memory, suicidal thoughts with specific plan, anxiety, panic attacks, loss of energy/fatigue, disturbed sleep, decreased appetite, (Hypo) Manic Symptoms:   Reports None Anxiety Symptoms:  Excessive Worry, Panic Symptoms, Psychotic Symptoms:   Reports None PTSD Symptoms: Hyperarousal:  Increased Startle Response Total Time spent with patient: 1 hour  Past Psychiatric History:  Depression and Anxiety, 2 Suicide Attempts (last- OD 04/11/2023), a remote history of Self Injurious Behavior (Cutting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  Is the patient at risk to self? Yes.    Has the patient been a risk to self in the past 6 months? Yes.    Has the patient been a risk to self within the distant past? Yes.    Is the patient a risk to others?  No.  Has the patient been a risk to others in the past 6 months? No.  Has the patient been a risk to others within the distant past? No.   [place] Scale:  Flowsheet Row [facility]  [facility] ED from 04/29/2023 in [facility]  C-SSRS RISK CATEGORY High Risk High Risk        Prior Inpatient Therapy: No. If yes, describe N/A  Prior Outpatient Therapy: Yes.   If yes, describe Dr. Alene Husk @ [facility], therapy through [facility]  Alcohol Screening: 1. How often do you have a drink containing alcohol?: Monthly or less 2. How many drinks containing alcohol do you have on a typical day when you are drinking?: 1 or 2 3. How often do you have six or more drinks on one occasion?: Never AUDIT-C Score: 1 4. How often during the last year have you found that you were not able to stop drinking once you had started?: Never 5. How often during the last year have you failed to do what was normally expected from you because of drinking?: Never 6. How often during the last year have you needed a first drink in the morning to get yourself going after a heavy drinking session?: Never 7. How often during the last year have you had a feeling of guilt of remorse after drinking?: Never 8. How often during the last year have you been unable to remember what happened the night before because you had been drinking?: Never 9. Have you or someone else been injured as a result of your drinking?: No 10. Has a relative or friend or a doctor or another health worker been concerned about your drinking or suggested you cut down?: No Alcohol Use Disorder Identification Test Final Score (AUDIT): 1 Alcohol Brief Interventions/Follow-up: Alcohol education/Brief advice Substance Abuse History in the last 12 months:  No. Consequences of Substance Abuse: NA Previous Psychotropic Medications: Yes  Wellbutrin, Gabapentin, Doxepin, Prozac. Psychological Evaluations: No  Past Medical History:  Past Medical History:  Diagnosis Date   Fracture    left fibula   Murmur, cardiac    Obesity     Past Surgical History:  Procedure Laterality Date   ORIF ANKLE FRACTURE Left 05/08/2017    Procedure: OPEN REDUCTION INTERNAL FIXATION (ORIF) LEFT ANKLE FRACTURE;  Surgeon: Janeth Medicus, MD;  Location: [facility] OR;  Service: Orthopedics;  Laterality: Left;   Family History:  Family History  Problem Relation Age of Onset   Cancer Maternal Aunt    Hypertension Maternal Grandmother    Stroke Maternal Grandmother    Heart disease Maternal Grandmother    Diabetes Maternal Grandfather    Hypertension Maternal Grandfather    Diabetes Mother    Asthma Mother    Family Psychiatric  History:  Maternal Cousin- Mental Break Maternal Cousin- Completed Suicide shot self with gun Maternal Uncle and Cousin- EtOH Abuse Father- EtOH Abuse  Tobacco Screening:  Social History   Tobacco Use  Smoking Status Never  Smokeless Tobacco Never    [facility] Tobacco Counseling     Are you interested in Tobacco Cessation Medications?  N/A, patient does not use tobacco products Counseled patient on smoking cessation:  N/A, patient does not use tobacco products Reason Tobacco Screening Not Completed: No value filed.       Social History:  Social History   Substance and Sexual Activity  Alcohol Use Yes   Alcohol/week: 0.0 standard drinks  of alcohol   Comment: occasional     Social History   Substance and Sexual Activity  Drug Use No    Additional Social History: Marital status: Single Are you sexually active?: Yes What is your sexual orientation?: Hertosexual Has your sexual activity been affected by drugs, alcohol, medication, or emotional stress?: Patient reports that she has been affected by the medication with her sexual activity Does patient have children?: No                         Allergies:  No Known Allergies Lab Results:  Results for orders placed or performed during the hospital encounter of 04/29/23 (from the past 48 hours)  CBC with Differential/Platelet     Status: None   Collection Time: 04/29/23  5:47 PM  Result Value Ref Range   WBC 6.9 4.0 - 10.5 K/uL    RBC 4.44 3.87 - 5.11 MIL/uL   Hemoglobin 13.0 12.0 - 15.0 g/dL   HCT 29.5 62.1 - 30.8 %   MCV 90.8 80.0 - 100.0 fL   MCH 29.3 26.0 - 34.0 pg   MCHC 32.3 30.0 - 36.0 g/dL   RDW 65.7 84.6 - 96.2 %   Platelets 278 150 - 400 K/uL   nRBC 0.0 0.0 - 0.2 %   Neutrophils Relative % 44 %   Neutro Abs 3.0 1.7 - 7.7 K/uL   Lymphocytes Relative 43 %   Lymphs Abs 3.0 0.7 - 4.0 K/uL   Monocytes Relative 9 %   Monocytes Absolute 0.7 0.1 - 1.0 K/uL   Eosinophils Relative 3 %   Eosinophils Absolute 0.2 0.0 - 0.5 K/uL   Basophils Relative 1 %   Basophils Absolute 0.1 0.0 - 0.1 K/uL   Immature Granulocytes 0 %   Abs Immature Granulocytes 0.02 0.00 - 0.07 K/uL    Comment: Performed at [facility], [address] [place], [place] [address]  Comprehensive metabolic panel     Status: None   Collection Time: 04/29/23  5:47 PM  Result Value Ref Range   Sodium 137 135 - 145 mmol/L   Potassium 3.5 3.5 - 5.1 mmol/L   Chloride 102 98 - 111 mmol/L   CO2 23 22 - 32 mmol/L   Glucose, Bld 86 70 - 99 mg/dL    Comment: Glucose reference range applies only to samples taken after fasting for at least 8 hours.   BUN 12 6 - 20 mg/dL   Creatinine, Ser 1.32 0.44 - 1.00 mg/dL   Calcium 8.9 8.9 - 44.0 mg/dL   Total Protein 7.0 6.5 - 8.1 g/dL   Albumin 3.8 3.5 - 5.0 g/dL   AST 20 15 - 41 U/L   ALT 21 0 - 44 U/L   Alkaline Phosphatase 41 38 - 126 U/L   Total Bilirubin 0.5 0.0 - 1.2 mg/dL   GFR, Estimated >10 >27 mL/min    Comment: (NOTE) Calculated using the CKD-EPI Creatinine Equation (2021)    Anion gap 12 5 - 15    Comment: Performed at [facility], [address] [place], [place] [address]  Hemoglobin A1c     Status: None   Collection Time: 04/29/23  5:47 PM  Result Value Ref Range   Hgb A1c MFr Bld 5.3 4.8 - 5.6 %    Comment: (NOTE) Pre diabetes:          5.7%-6.4%  Diabetes:              >  6.4%  Glycemic control for   <7.0% adults with diabetes    Mean Plasma Glucose 105.41 mg/dL     Comment: Performed at [facility], [address], [place], [place] [address]  Ethanol     Status: None   Collection Time: 04/29/23  5:47 PM  Result Value Ref Range   Alcohol, Ethyl (B) <15 <15 mg/dL    Comment: Please note change in reference range. (NOTE) For medical purposes only. Performed at [facility], [address], [place], [place] [address]   Lipid panel     Status: None   Collection Time: 04/29/23  5:47 PM  Result Value Ref Range   Cholesterol 161 0 - 200 mg/dL   Triglycerides 78 <295 mg/dL   HDL 73 >62 mg/dL   Total CHOL/HDL Ratio 2.2 RATIO   VLDL 16 0 - 40 mg/dL   LDL Cholesterol 72 0 - 99 mg/dL    Comment:        Total Cholesterol/HDL:CHD Risk Coronary Heart Disease Risk Table                     Men   Women  1/2 Average Risk   3.4   3.3  Average Risk       5.0   4.4  2 X Average Risk   9.6   7.1  3 X Average Risk  23.4   11.0        Use the calculated Patient Ratio above and the CHD Risk Table to determine the patient's CHD Risk.        ATP III CLASSIFICATION (LDL):  <100     mg/dL   Optimal  130-865  mg/dL   Near or Above                    Optimal  130-159  mg/dL   Borderline  784-696  mg/dL   High  >295     mg/dL   Very High Performed at [facility], [address], [place], [place] [address]   TSH     Status: None   Collection Time: 04/29/23  5:47 PM  Result Value Ref Range   TSH 2.121 0.350 - 4.500 uIU/mL    Comment: Performed by a 3rd Generation assay with a functional sensitivity of <=0.01 uIU/mL. Performed at [facility], [address] [place], [place] [address]   POCT Urine Drug Screen - (I-Screen)     Status: Normal   Collection Time: 04/29/23  5:48 PM  Result Value Ref Range   POC Amphetamine UR None Detected    POC Secobarbital (BAR) None Detected    POC Buprenorphine (BUP) None Detected    POC Oxazepam (BZO) None Detected    POC Cocaine UR None Detected    POC Methamphetamine UR None Detected     POC Morphine None Detected    POC Methadone UR None Detected    POC Oxycodone  UR None Detected    POC Marijuana UR None Detected   Pregnancy, urine POC     Status: None   Collection Time: 04/29/23  5:49 PM  Result Value Ref Range   Preg Test, Ur NEGATIVE NEGATIVE    Comment:        THE SENSITIVITY OF THIS METHODOLOGY IS >24 mIU/mL     Blood Alcohol level:  Lab Results  Component Value Date   [facility] <15 04/29/2023    Metabolic Disorder  Labs:  Lab Results  Component Value Date   HGBA1C 5.3 04/29/2023   MPG 105.41 04/29/2023   No results found for: "PROLACTIN" Lab Results  Component Value Date   CHOL 161 04/29/2023   TRIG 78 04/29/2023   HDL 73 04/29/2023   CHOLHDL 2.2 04/29/2023   VLDL 16 04/29/2023   LDLCALC 72 04/29/2023    Current Medications: Current Facility-Administered Medications  Medication Dose Route Frequency Provider Last Rate Last Admin   acetaminophen (TYLENOL) tablet 650 mg  650 mg Oral Q6H PRN Brent, Amanda C, NP       alum & mag hydroxide-simeth (MAALOX/MYLANTA) 200-200-20 MG/5ML suspension 30 mL  30 mL Oral Q4H PRN Brent, Amanda C, NP       buPROPion (WELLBUTRIN XL) 24 hr tablet 150 mg  150 mg Oral Daily Brent, Amanda C, NP   150 mg at 04/30/23 0759   haloperidol (HALDOL) tablet 5 mg  5 mg Oral TID PRN Brent, Amanda C, NP       And   diphenhydrAMINE (BENADRYL) capsule 50 mg  50 mg Oral TID PRN Brent, Amanda C, NP       haloperidol lactate (HALDOL) injection 5 mg  5 mg Intramuscular TID PRN Brent, Amanda C, NP       And   diphenhydrAMINE (BENADRYL) injection 50 mg  50 mg Intramuscular TID PRN Brent, Amanda C, NP       And   LORazepam (ATIVAN) injection 2 mg  2 mg Intramuscular TID PRN Brent, Amanda C, NP       haloperidol lactate (HALDOL) injection 10 mg  10 mg Intramuscular TID PRN Brent, Amanda C, NP       And   diphenhydrAMINE (BENADRYL) injection 50 mg  50 mg Intramuscular TID PRN Brent, Amanda C, NP       And   LORazepam (ATIVAN) injection 2 mg   2 mg Intramuscular TID PRN Brent, Amanda C, NP       gabapentin (NEURONTIN) capsule 100 mg  100 mg Oral TID Brent, Amanda C, NP   100 mg at 04/30/23 1116   magnesium hydroxide (MILK OF MAGNESIA) suspension 30 mL  30 mL Oral Daily PRN Brent, Amanda C, NP       traZODone (DESYREL) tablet 50 mg  50 mg Oral QHS PRN Brent, Amanda C, NP       PTA Medications: Medications Prior to Admission  Medication Sig Dispense Refill Last Dose/Taking   buPROPion (WELLBUTRIN XL) 300 MG 24 hr tablet Take 300 mg by mouth daily.   Taking   gabapentin (NEURONTIN) 100 MG capsule Take 100 mg by mouth 3 (three) times daily.   Taking   etonogestrel (NEXPLANON) 68 MG IMPL implant 1 each by Subdermal route once.       Musculoskeletal: Strength & Muscle Tone: within normal limits Gait & Station: normal Patient leans: N/A            Psychiatric Specialty Exam:  Presentation  General Appearance:  Appropriate for Environment; Casual  Eye Contact: Good  Speech: Clear and Coherent; Normal Rate  Speech Volume: Normal  Handedness:No data recorded  Mood and Affect  Mood: Dysphoric  Affect: Congruent; Depressed   Thought Process  Thought Processes: Coherent; Goal Directed  Duration of Psychotic Symptoms:N/A Past Diagnosis of Schizophrenia or Psychoactive disorder: No  Descriptions of Associations:Intact  Orientation:Full (Time, Place and Person)  Thought Content:Logical; WDL  Hallucinations:Hallucinations: None  Ideas of Reference:None  Suicidal Thoughts:Suicidal Thoughts: Yes, Active SI Active  Intent and/or Plan: With Plan  Homicidal Thoughts:Homicidal Thoughts: No   Sensorium  Memory: Immediate Fair  Judgment: Fair  Insight: Fair   Chartered certified accountant: Fair  Attention Span: Fair  Recall: [employer] of Knowledge: Fair  Language: Fair   Psychomotor Activity  Psychomotor Activity: Psychomotor Activity: Normal   Assets   Assets: Manufacturing systems engineer; Desire for Improvement; Financial Resources/Insurance; Housing; Physical Health; Resilience; Vocational/Educational   Sleep  Sleep: Sleep: Poor Number of Hours of Sleep: 4    Physical Exam: Physical Exam Vitals and nursing note reviewed.  Constitutional:      General: She is not in acute distress.    Appearance: Normal appearance. She is obese. She is not ill-appearing or toxic-appearing.  HENT:     Head: Normocephalic and atraumatic.  Pulmonary:     Effort: Pulmonary effort is normal.  Musculoskeletal:        General: Normal range of motion.  Neurological:     General: No focal deficit present.     Mental Status: She is alert.    Review of Systems  Respiratory:  Negative for cough and shortness of breath.   Cardiovascular:  Negative for chest pain.  Gastrointestinal:  Negative for abdominal pain, constipation, diarrhea, nausea and vomiting.  Neurological:  Negative for dizziness, weakness and headaches.  Psychiatric/Behavioral:  Positive for depression and suicidal ideas. Negative for hallucinations. The patient is nervous/anxious.    Blood pressure 132/77, pulse 71, temperature 98.2 F (36.8 C), temperature source Oral, resp. rate 16, height 5\' 3"  (1.6 m), weight 104.8 kg, SpO2 100%. Body mass index is 40.92 kg/m.  Treatment Plan Summary: Daily contact with patient to assess and evaluate symptoms and progress in treatment and Medication management  Cindy Cross is a 28 yr old female who presented on  PPHx is significant for Depression and Anxiety, 2 Suicide Attempts (last- OD 04/11/2023), a remote history of Self Injurious Behavior (Cutting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   Noriyah does meet criteria for MDD and GAD.  Since she had multiple stressors converge on top of stopping her medications, restarting her medications which were helpful we will is appropriate.  She was already restarted on her Wellbutrin  and gabapentin so we will continue with these  We will not make any other changes to her medication at this time.    MDD, Recurrent, Severe, w/out Psychosis  GAD: -Continue Wellbutrin XL 150 mg daily for depression and anxiety -Continue Gabapentin 100 mg TID for anxiety -Continue Agitation Protocol: Haldol/Ativan/Benadryl   -Continue PRN's: Tylenol, Maalox, Milk of Magnesia, Trazodone   Observation Level/Precautions:  15 minute checks  Laboratory:  CMP: WNL,  CBC: WNL,  Lipid Panel: WNL,  TSH: 2.121,  A1c: 5.3,  UDS: Neg,  Urine Preg: Neg,  EKG: NSR w/ Qtc: 420  Psychotherapy:    Medications:  Wellbutrin, Gabapentin  Consultations:    Discharge Concerns:    Estimated LOS: 4-6 days  Other:     Physician Treatment Plan for Primary Diagnosis: MDD (major depressive disorder), recurrent episode, severe (HCC) Long Term Goal(s): Improvement in symptoms so as ready for discharge  Short Term Goals: Ability to identify changes in lifestyle to reduce recurrence of condition will improve, Ability to verbalize feelings will improve, Ability to disclose and discuss suicidal ideas, Ability to identify and develop effective coping behaviors will improve, and Ability to maintain clinical measurements within normal limits will improve  Physician Treatment Plan for Secondary Diagnosis: Principal Problem:  MDD (major depressive disorder), recurrent episode, severe (HCC) Active Problems:   GAD (generalized anxiety disorder)  Long Term Goal(s): Improvement in symptoms so as ready for discharge  Short Term Goals: Ability to identify changes in lifestyle to reduce recurrence of condition will improve, Ability to verbalize feelings will improve, Ability to disclose and discuss suicidal ideas, Ability to identify and develop effective coping behaviors will improve, and Ability to maintain clinical measurements within normal limits will improve  I certify that inpatient services furnished can reasonably be  expected to improve the patient's condition.    Basilia Bosworth, MD 4/27/20253:25 PM

## 2023-05-01 ENCOUNTER — Encounter (HOSPITAL_COMMUNITY): Payer: Self-pay

## 2023-05-01 DIAGNOSIS — F332 Major depressive disorder, recurrent severe without psychotic features: Secondary | ICD-10-CM | POA: Diagnosis not present

## 2023-05-01 LAB — FOLATE: Folate: 10 ng/mL (ref >5.9)

## 2023-05-01 LAB — VITAMIN D 25 HYDROXY (VIT D DEFICIENCY, FRACTURES): Vit D, 25-Hydroxy: 20.73 ng/mL — ABNORMAL LOW (ref 30–100)

## 2023-05-01 LAB — VITAMIN B12: Vitamin B-12: 125 pg/mL — ABNORMAL LOW (ref 180–914)

## 2023-05-01 NOTE — [facility] Group Notes (Signed)
 [facility] Group Notes:  (Nursing/MHT/Case Management/Adjunct)  Date:  05/01/2023  Time:  11:37 PM  Type of Therapy:  Psychoeducational Skills  Participation Level:  Active  Participation Quality:  Appropriate  Affect:  Appropriate  Cognitive:  Appropriate  Insight:  Appropriate  Engagement in Group:  Engaged  Modes of Intervention:  Education  Summary of Progress/Problems: The patient rated her day as a 7 out of a possible 10. She shared in group that she has been feeling stressed out since she is scheduled to move on 05-03-23 and that her friend who is helping her with moving is overwhelmed. Her positive event for the day is that she finished reading a long book today.   Audrick Lamoureaux S 05/01/2023, 11:37 PM

## 2023-05-01 NOTE — Progress Notes (Signed)
 [facility] MD Progress Note  05/01/2023 5:27 PM Cindy Cross  MRN:  [id number] Subjective: Cindy Cross states, "I am fine now and getting ready for discharge " Principal Problem: MDD (major depressive disorder), recurrent episode, severe (HCC) Diagnosis: Principal Problem:   MDD (major depressive disorder), recurrent episode, severe (HCC) Active Problems:   GAD (generalized anxiety disorder)  Reason for admission: Cindy Cross is a 28 yr old female who presented on 04/30/2023 with prior psychiatric diagnoses significant for Depression and Anxiety, 2 Suicide Attempts (last- OD 04/11/2023), a remote history of Self Injurious Behavior (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  24 hours chart review: Patient compliance with scheduled psychotropic medications.  No as needed required.  No agitation protocol required.  Vital signs without critical values.  Yesterday the psychiatry team made the following recommendations:  Today's assessment notes: On assessment today, the pt reports that her mood is less depressed and she presents with pleasant mood.  Mood is congruent with affect.  Report depression as number is 0/10, with 10 being high severity.  Not sure if patient is minimizing her symptoms at this time as she reports she is ready to be discharged.  Speech is clear and coherent with normal volume and pattern.  Patient reports that she was overwhelmed with her stressors of financial insecurity, childhood traumas, and life events and decided to present to [facility] for medication management.  Reports she currently lives with a coworker and she has an appointment at [employer] station.  She denies delusional thinking, paranoia, SI, HI, or AVH. Reports that anxiety is at manageable level and rates as #3/10, with 10 being high severity Nursing staff report patient sleeping over 8.75 hours.   Appetite is good Concentration is improving Energy level is adequate Denies suicidal  thoughts.  Further denies suicidal intent and plan.  Denies having any HI.  Denies having psychotic symptoms.   Denies having side effects to current psychiatric medications.   We discussed compliance to current medication regimen.  Patient endorses agreement. Discussed the following psychosocial stressors: Emotional support, empathy, and therapeutic listening provided to patient for ongoing stressors.  Total Time spent with patient: 45 minutes  Past Psychiatric History: Depression and Anxiety, 2 Suicide Attempts (last- OD 04/11/2023), a remote history of Self Injurious Behavior (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   Past Medical History:  Past Medical History:  Diagnosis Date   Fracture    left fibula   Murmur, cardiac    Obesity     Past Surgical History:  Procedure Laterality Date   ORIF ANKLE FRACTURE Left 05/08/2017   Procedure: OPEN REDUCTION INTERNAL FIXATION (ORIF) LEFT ANKLE FRACTURE;  Surgeon: Janeth Medicus, MD;  Location: [facility] OR;  Service: Orthopedics;  Laterality: Left;   Family History:  Family History  Problem Relation Age of Onset   Cancer Maternal Aunt    Hypertension Maternal Grandmother    Stroke Maternal Grandmother    Heart disease Maternal Grandmother    Diabetes Maternal Grandfather    Hypertension Maternal Grandfather    Diabetes Mother    Asthma Mother    Family Psychiatric  History: See H&P Social History:  Social History   Substance and Sexual Activity  Alcohol Use Yes   Alcohol/week: 0.0 standard drinks of alcohol   Comment: occasional     Social History   Substance and Sexual Activity  Drug Use No    Social History   Socioeconomic History   Marital status:  Single    Spouse name: Not on file   Number of children: Not on file   Years of education: Not on file   Highest education level: Not on file  Occupational History   Not on file  Tobacco Use   Smoking status: Never   Smokeless tobacco:  Never  Vaping Use   Vaping status: Never Used  Substance and Sexual Activity   Alcohol use: Yes    Alcohol/week: 0.0 standard drinks of alcohol    Comment: occasional   Drug use: No   Sexual activity: Not on file  Other Topics Concern   Not on file  Social History Narrative   Not on file   Social Drivers of Health   Financial Resource Strain: Medium Risk (02/22/2023)   Received from [facility]   Overall Financial Resource Strain (CARDIA)    Difficulty of Paying Living Expenses: Somewhat hard  Food Insecurity: Food Insecurity Present (04/30/2023)   Hunger Vital Sign    Worried About Running Out of Food in the Last Year: Sometimes true    Ran Out of Food in the Last Year: Sometimes true  Transportation Needs: No Transportation Needs (04/30/2023)   PRAPARE - Administrator, Civil Service (Medical): No    Lack of Transportation (Non-Medical): No  Physical Activity: Inactive (02/22/2023)   Received from [facility]   Exercise Vital Sign    Days of Exercise per Week: 0 days    Minutes of Exercise per Session: 30 min  Stress: Stress Concern Present (02/22/2023)   Received from [facility] - Occupational Stress Questionnaire    Feeling of Stress : To some extent  Social Connections: Somewhat Isolated (02/22/2023)   Received from [facility]   Social Network    How would you rate your social network (family, work, friends)?: Restricted participation with some degree of social isolation   Additional Social History:     Sleep: Good  Appetite:  Good  Current Medications: Current Facility-Administered Medications  Medication Dose Route Frequency Provider Last Rate Last Admin   acetaminophen (TYLENOL) tablet 650 mg  650 mg Oral Q6H PRN Cross, Cindy C, NP       alum & mag hydroxide-simeth (MAALOX/MYLANTA) 200-200-20 MG/5ML suspension 30 mL  30 mL Oral Q4H PRN Cross, Cindy C, NP       buPROPion (WELLBUTRIN XL) 24 hr  tablet 150 mg  150 mg Oral Daily Cross, Cindy C, NP   150 mg at 05/01/23 0805   haloperidol (HALDOL) tablet 5 mg  5 mg Oral TID PRN Cross, Cindy C, NP       And   diphenhydrAMINE (BENADRYL) capsule 50 mg  50 mg Oral TID PRN Cross, Cindy C, NP       haloperidol lactate (HALDOL) injection 5 mg  5 mg Intramuscular TID PRN Cross, Cindy C, NP       And   diphenhydrAMINE (BENADRYL) injection 50 mg  50 mg Intramuscular TID PRN Cross, Cindy C, NP       And   LORazepam (ATIVAN) injection 2 mg  2 mg Intramuscular TID PRN Cross, Cindy C, NP       haloperidol lactate (HALDOL) injection 10 mg  10 mg Intramuscular TID PRN Cross, Cindy C, NP       And   diphenhydrAMINE (BENADRYL) injection 50 mg  50 mg Intramuscular TID PRN Cindy Erps, NP       And  LORazepam (ATIVAN) injection 2 mg  2 mg Intramuscular TID PRN Cross, Cindy C, NP       gabapentin (NEURONTIN) capsule 100 mg  100 mg Oral TID Cindy Auerbach C, NP   100 mg at 05/01/23 1304   magnesium hydroxide (MILK OF MAGNESIA) suspension 30 mL  30 mL Oral Daily PRN Cross, Cindy C, NP       traZODone (DESYREL) tablet 50 mg  50 mg Oral QHS PRN Cindy Erps, NP       Lab Results:  Results for orders placed or performed during the hospital encounter of 04/29/23 (from the past 48 hours)  CBC with Differential/Platelet     Status: None   Collection Time: 04/29/23  5:47 PM  Result Value Ref Range   WBC 6.9 4.0 - 10.5 K/uL   RBC 4.44 3.87 - 5.11 MIL/uL   Hemoglobin 13.0 12.0 - 15.0 g/dL   HCT 16.1 09.6 - 04.5 %   MCV 90.8 80.0 - 100.0 fL   MCH 29.3 26.0 - 34.0 pg   MCHC 32.3 30.0 - 36.0 g/dL   RDW 40.9 81.1 - 91.4 %   Platelets 278 150 - 400 K/uL   nRBC 0.0 0.0 - 0.2 %   Neutrophils Relative % 44 %   Neutro Abs 3.0 1.7 - 7.7 K/uL   Lymphocytes Relative 43 %   Lymphs Abs 3.0 0.7 - 4.0 K/uL   Monocytes Relative 9 %   Monocytes Absolute 0.7 0.1 - 1.0 K/uL   Eosinophils Relative 3 %   Eosinophils Absolute 0.2 0.0 - 0.5 K/uL   Basophils  Relative 1 %   Basophils Absolute 0.1 0.0 - 0.1 K/uL   Immature Granulocytes 0 %   Abs Immature Granulocytes 0.02 0.00 - 0.07 K/uL    Comment: Performed at [facility], [address], [place], [place] [address]  Comprehensive metabolic panel     Status: None   Collection Time: 04/29/23  5:47 PM  Result Value Ref Range   Sodium 137 135 - 145 mmol/L   Potassium 3.5 3.5 - 5.1 mmol/L   Chloride 102 98 - 111 mmol/L   CO2 23 22 - 32 mmol/L   Glucose, Bld 86 70 - 99 mg/dL    Comment: Glucose reference range applies only to samples taken after fasting for at least 8 hours.   BUN 12 6 - 20 mg/dL   Creatinine, Ser 6.21 0.44 - 1.00 mg/dL   Calcium 8.9 8.9 - 30.8 mg/dL   Total Protein 7.0 6.5 - 8.1 g/dL   Albumin 3.8 3.5 - 5.0 g/dL   AST 20 15 - 41 U/L   ALT 21 0 - 44 U/L   Alkaline Phosphatase 41 38 - 126 U/L   Total Bilirubin 0.5 0.0 - 1.2 mg/dL   GFR, Estimated >65 >78 mL/min    Comment: (NOTE) Calculated using the CKD-EPI Creatinine Equation (2021)    Anion gap 12 5 - 15    Comment: Performed at [facility], [address] [place], [place] [address]  Hemoglobin A1c     Status: None   Collection Time: 04/29/23  5:47 PM  Result Value Ref Range   Hgb A1c MFr Bld 5.3 4.8 - 5.6 %    Comment: (NOTE) Pre diabetes:          5.7%-6.4%  Diabetes:              >6.4%  Glycemic control for   <7.0% adults  with diabetes    Mean Plasma Glucose 105.41 mg/dL    Comment: Performed at [facility], [address], [place], [place] [address]  Ethanol     Status: None   Collection Time: 04/29/23  5:47 PM  Result Value Ref Range   Alcohol, Ethyl (B) <15 <15 mg/dL    Comment: Please note change in reference range. (NOTE) For medical purposes only. Performed at [facility], [address], [place], [place] [address]   Lipid panel     Status: None   Collection Time: 04/29/23  5:47 PM  Result Value Ref Range   Cholesterol 161 0 - 200 mg/dL   Triglycerides 78 <098  mg/dL   HDL 73 >11 mg/dL   Total CHOL/HDL Ratio 2.2 RATIO   VLDL 16 0 - 40 mg/dL   LDL Cholesterol 72 0 - 99 mg/dL    Comment:        Total Cholesterol/HDL:CHD Risk Coronary Heart Disease Risk Table                     Men   Women  1/2 Average Risk   3.4   3.3  Average Risk       5.0   4.4  2 X Average Risk   9.6   7.1  3 X Average Risk  23.4   11.0        Use the calculated Patient Ratio above and the CHD Risk Table to determine the patient's CHD Risk.        ATP III CLASSIFICATION (LDL):  <100     mg/dL   Optimal  914-782  mg/dL   Near or Above                    Optimal  130-159  mg/dL   Borderline  956-213  mg/dL   High  >086     mg/dL   Very High Performed at [facility], [address] [place], [place] [address]   TSH     Status: None   Collection Time: 04/29/23  5:47 PM  Result Value Ref Range   TSH 2.121 0.350 - 4.500 uIU/mL    Comment: Performed by a 3rd Generation assay with a functional sensitivity of <=0.01 uIU/mL. Performed at [facility], [address] [place], [place] [address]   POCT Urine Drug Screen - (I-Screen)     Status: Normal   Collection Time: 04/29/23  5:48 PM  Result Value Ref Range   POC Amphetamine UR None Detected    POC Secobarbital (BAR) None Detected    POC Buprenorphine (BUP) None Detected    POC Oxazepam (BZO) None Detected    POC Cocaine UR None Detected    POC Methamphetamine UR None Detected    POC Morphine None Detected    POC Methadone UR None Detected    POC Oxycodone  UR None Detected    POC Marijuana UR None Detected   Pregnancy, urine POC     Status: None   Collection Time: 04/29/23  5:49 PM  Result Value Ref Range   Preg Test, Ur NEGATIVE NEGATIVE    Comment:        THE SENSITIVITY OF THIS METHODOLOGY IS >24 mIU/mL    Blood Alcohol level:  Lab Results  Component Value Date   [facility] <15 04/29/2023   Metabolic Disorder Labs: Lab Results  Component Value Date   HGBA1C 5.3 04/29/2023  MPG 105.41  04/29/2023   No results found for: "PROLACTIN" Lab Results  Component Value Date   CHOL 161 04/29/2023   TRIG 78 04/29/2023   HDL 73 04/29/2023   CHOLHDL 2.2 04/29/2023   VLDL 16 04/29/2023   LDLCALC 72 04/29/2023   Physical Findings: AIMS:  , ,  ,  ,    CIWA:    COWS:     Musculoskeletal: Strength & Muscle Tone: within normal limits Gait & Station: normal Patient leans: N/A  Psychiatric Specialty Exam:  Presentation  General Appearance:  Appropriate for Environment; Casual  Eye Contact: Good  Speech: Clear and Coherent  Speech Volume: Normal  Handedness: Right  Mood and Affect  Mood: Anxious; Depressed  Affect: Appropriate; Congruent  Thought Process  Thought Processes: Coherent; Goal Directed  Descriptions of Associations:Intact  Orientation:Full (Time, Place and Person)  Thought Content:Logical; WDL  History of Schizophrenia/Schizoaffective disorder:No  Duration of Psychotic Symptoms:No data recorded Hallucinations:Hallucinations: None  Ideas of Reference:None  Suicidal Thoughts:Suicidal Thoughts: No SI Active Intent and/or Plan: -- (Denies)  Homicidal Thoughts:Homicidal Thoughts: No  Sensorium  Memory: Immediate Fair  Judgment: Fair  Insight: Fair  Executive Functions  Concentration: Good  Attention Span: Good  Recall: Fair  Fund of Knowledge: Fair  Language: Good  Psychomotor Activity  Psychomotor Activity: Psychomotor Activity: Normal  Assets  Assets: Desire for Improvement; Communication Skills; Physical Health; Resilience  Sleep  Sleep: Sleep: Good Number of Hours of Sleep: 8.75  Physical Exam: Physical Exam Vitals and nursing note reviewed.  Constitutional:      Appearance: She is obese.  HENT:     Head: Normocephalic.     Right Ear: External ear normal.     Left Ear: External ear normal.     Nose: Nose normal.     Mouth/Throat:     Mouth: Mucous membranes are moist.     Pharynx:  Oropharynx is clear.  Eyes:     Extraocular Movements: Extraocular movements intact.  Cardiovascular:     Rate and Rhythm: Normal rate.     Pulses: Normal pulses.  Pulmonary:     Effort: Pulmonary effort is normal.  Abdominal:     Comments: Deferred  Genitourinary:    Comments: Deferred Musculoskeletal:        General: Normal range of motion.     Cervical back: Normal range of motion.  Skin:    General: Skin is warm.  Neurological:     General: No focal deficit present.     Mental Status: She is alert and oriented to person, place, and time.  Psychiatric:        Mood and Affect: Mood normal.        Behavior: Behavior normal.        Thought Content: Thought content normal.    Review of Systems  Constitutional:  Negative for chills and fever.  HENT:  Negative for sore throat.   Eyes:  Negative for blurred vision.  Respiratory:  Negative for cough, sputum production, shortness of breath and wheezing.   Cardiovascular:  Negative for chest pain and palpitations.  Gastrointestinal:  Negative for heartburn and nausea.  Genitourinary:  Negative for dysuria, frequency and urgency.  Musculoskeletal:  Negative for myalgias.  Skin:  Negative for itching and rash.  Neurological:  Negative for dizziness and headaches.  Endo/Heme/Allergies:        See allergy listing  Psychiatric/Behavioral:  Positive for depression. Negative for hallucinations, substance abuse and suicidal ideas. The patient is nervous/anxious.  The patient does not have insomnia.    Blood pressure 115/74, pulse 64, temperature 98.6 F (37 Cross), temperature source Oral, resp. rate 16, height 5\' 3"  (1.6 m), weight 104.8 kg, SpO2 100%. Body mass index is 40.92 kg/m.   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     Anthea does meet criteria for MDD and GAD.  Since she had multiple stressors converge on top of stopping her medications, restarting her medications which were helpful we will is appropriate.  She was already restarted on her Wellbutrin and gabapentin so we will continue with these  We will not make any other changes to her medication at this time.      MDD, Recurrent, Severe, w/out Psychosis  GAD: -Continue Wellbutrin XL 150 mg daily for depression and anxiety -Continue Gabapentin 100 mg TID for anxiety -Continue Agitation Protocol: Haldol/Ativan/Benadryl     -Continue PRN's: Tylenol, Maalox, Milk of Magnesia, Trazodone     Observation Level/Precautions:  15 minute checks  Laboratory:  CMP: WNL,  CBC: WNL,  Lipid Panel: WNL,  TSH: 2.121,  A1c: 5.3,  UDS: Neg,  Urine Preg: Neg,  EKG: NSR w/ Qtc: 420  Psychotherapy:    Medications:  Wellbutrin, Gabapentin  Consultations:    Discharge Concerns:    Estimated LOS: 4-6 days  Other:      Physician Treatment Plan for Primary Diagnosis: MDD (major depressive disorder), recurrent episode, severe (HCC) Long Term Goal(s): Improvement in symptoms so as ready for discharge   Short Term Goals: Ability to identify changes in lifestyle to reduce recurrence of condition will improve, Ability to verbalize feelings will improve, Ability to disclose and discuss suicidal ideas, Ability to identify and develop effective coping behaviors will improve, and Ability to maintain clinical measurements within normal limits will improve   Physician Treatment Plan for Secondary Diagnosis: Principal Problem:   MDD (major depressive disorder), recurrent episode, severe (HCC) Active Problems:   GAD (generalized anxiety disorder)   Long Term Goal(s): Improvement in symptoms so as ready for discharge   Short Term Goals:  Ability to identify changes in lifestyle to reduce recurrence of condition will improve, Ability to verbalize feelings will improve, Ability to disclose and discuss suicidal ideas, Ability to identify and develop effective coping behaviors will improve, and Ability to maintain clinical measurements within normal limits will improve   I certify that inpatient services furnished can reasonably be expected to improve the patient's condition.   Cindy Pons, FNP 05/01/2023, 5:27 PM

## 2023-05-01 NOTE — Group Note (Signed)
 Date:  05/01/2023 Time:  9:29 AM  Group Topic/Focus:  Goals Group:   The focus of this group is to help patients establish daily goals to achieve during treatment and discuss how the patient can incorporate goal setting into their daily lives to aide in recovery.    Participation Level:  Active  Participation Quality:  Appropriate  Affect:  Appropriate  Cognitive:  Appropriate  Insight: Appropriate  Engagement in Group:  Engaged  Modes of Intervention:  Activity  Additional Comments:    Cindy Cross 05/01/2023, 9:29 AM

## 2023-05-01 NOTE — Plan of Care (Signed)
  Problem: Activity: Goal: Interest or engagement in activities will improve Outcome: Progressing   Problem: Coping: Goal: Ability to verbalize frustrations and anger appropriately will improve Outcome: Progressing   Problem: Physical Regulation: Goal: Ability to maintain clinical measurements within normal limits will improve Outcome: Progressing   

## 2023-05-01 NOTE — Group Note (Signed)
 Recreation Therapy Group Note   Group Topic:Stress Management  Group Date: 05/01/2023 Start Time: 0930 End Time: 8119 Facilitators: Rohith Fauth-McCall, LRT,CTRS Location: [address] Dayroom   Group Topic: Stress Management  Goal Area(s) Addresses:  Patient will identify positive stress management techniques. Patient will identify benefits of using stress management post d/c.  Intervention: Insight Timer App  Activity: Morning Meditation. LRT played a meditation that focused on preparing for the day ahead. The meditation guided patients to envision being on the beach and breathing with the flow of the water washing onto the sand. The meditation also encouraged patients to picture the positives they should expect during the day.    Education:  Stress Management, Discharge Planning.   Education Outcome: Acknowledges Education   Affect/Mood: Appropriate   Participation Level: Engaged   Participation Quality: Independent   Behavior: Appropriate   Speech/Thought Process: Focused   Insight: Good   Judgement: Good   Modes of Intervention: Meditation   Patient Response to Interventions:  Engaged   Education Outcome:  In group clarification offered    Clinical Observations/Individualized Feedback: Pt attended and participated in group session. Pt was attentive and focused throughout.     Plan: Continue to engage patient in RT group sessions 2-3x/week.   Daphney Hopke-McCall, LRT,CTRS 05/01/2023 12:59 PM

## 2023-05-01 NOTE — BH IP Treatment Plan (Signed)
 Interdisciplinary Treatment and Diagnostic Plan Update  05/01/2023 Time of Session: 11:20 AM [place] MRN: [id number]  Principal Diagnosis: MDD (major depressive disorder), recurrent episode, severe (HCC)  Secondary Diagnoses: Principal Problem:   MDD (major depressive disorder), recurrent episode, severe (HCC) Active Problems:   GAD (generalized anxiety disorder)   Current Medications:  Current Facility-Administered Medications  Medication Dose Route Frequency Provider Last Rate Last Admin   acetaminophen (TYLENOL) tablet 650 mg  650 mg Oral Q6H PRN Brent, Amanda C, NP       alum & mag hydroxide-simeth (MAALOX/MYLANTA) 200-200-20 MG/5ML suspension 30 mL  30 mL Oral Q4H PRN Brent, Amanda C, NP       buPROPion (WELLBUTRIN XL) 24 hr tablet 150 mg  150 mg Oral Daily Brent, Amanda C, NP   150 mg at 05/01/23 0805   haloperidol (HALDOL) tablet 5 mg  5 mg Oral TID PRN Brent, Amanda C, NP       And   diphenhydrAMINE (BENADRYL) capsule 50 mg  50 mg Oral TID PRN Brent, Amanda C, NP       haloperidol lactate (HALDOL) injection 5 mg  5 mg Intramuscular TID PRN Brent, Amanda C, NP       And   diphenhydrAMINE (BENADRYL) injection 50 mg  50 mg Intramuscular TID PRN Brent, Amanda C, NP       And   LORazepam (ATIVAN) injection 2 mg  2 mg Intramuscular TID PRN Brent, Amanda C, NP       haloperidol lactate (HALDOL) injection 10 mg  10 mg Intramuscular TID PRN Brent, Amanda C, NP       And   diphenhydrAMINE (BENADRYL) injection 50 mg  50 mg Intramuscular TID PRN Brent, Amanda C, NP       And   LORazepam (ATIVAN) injection 2 mg  2 mg Intramuscular TID PRN Brent, Amanda C, NP       gabapentin (NEURONTIN) capsule 100 mg  100 mg Oral TID Brent, Amanda C, NP   100 mg at 05/01/23 1816   magnesium hydroxide (MILK OF MAGNESIA) suspension 30 mL  30 mL Oral Daily PRN Brent, Amanda C, NP       traZODone (DESYREL) tablet 50 mg  50 mg Oral QHS PRN Brent, Amanda C, NP       PTA Medications: Medications  Prior to Admission  Medication Sig Dispense Refill Last Dose/Taking   buPROPion (WELLBUTRIN XL) 300 MG 24 hr tablet Take 300 mg by mouth daily.   Taking   gabapentin (NEURONTIN) 100 MG capsule Take 100 mg by mouth 3 (three) times daily.   Taking   etonogestrel (NEXPLANON) 68 MG IMPL implant 1 each by Subdermal route once.       Patient Stressors: Financial difficulties   Loss of housing   Traumatic event    Patient Strengths: Average or above average intelligence  Communication skills  Physical Health  Supportive family/friends   Treatment Modalities: Medication Management, Group therapy, Case management,  1 to 1 session with clinician, Psychoeducation, Recreational therapy.   Physician Treatment Plan for Primary Diagnosis: MDD (major depressive disorder), recurrent episode, severe (HCC) Long Term Goal(s): Improvement in symptoms so as ready for discharge   Short Term Goals: Ability to identify changes in lifestyle to reduce recurrence of condition will improve Ability to verbalize feelings will improve Ability to disclose and discuss suicidal ideas Ability to identify and develop effective coping behaviors will improve Ability to maintain clinical measurements within normal limits will improve  Medication Management: Evaluate patient's response, side effects, and tolerance of medication regimen.  Therapeutic Interventions: 1 to 1 sessions, Unit Group sessions and Medication administration.  Evaluation of Outcomes: Not Progressing  Physician Treatment Plan for Secondary Diagnosis: Principal Problem:   MDD (major depressive disorder), recurrent episode, severe (HCC) Active Problems:   GAD (generalized anxiety disorder)  Long Term Goal(s): Improvement in symptoms so as ready for discharge   Short Term Goals: Ability to identify changes in lifestyle to reduce recurrence of condition will improve Ability to verbalize feelings will improve Ability to disclose and discuss  suicidal ideas Ability to identify and develop effective coping behaviors will improve Ability to maintain clinical measurements within normal limits will improve     Medication Management: Evaluate patient's response, side effects, and tolerance of medication regimen.  Therapeutic Interventions: 1 to 1 sessions, Unit Group sessions and Medication administration.  Evaluation of Outcomes: Not Progressing   RN Treatment Plan for Primary Diagnosis: MDD (major depressive disorder), recurrent episode, severe (HCC) Long Term Goal(s): Knowledge of disease and therapeutic regimen to maintain health will improve  Short Term Goals: Ability to remain free from injury will improve, Ability to verbalize frustration and anger appropriately will improve, Ability to demonstrate self-control, Ability to participate in decision making will improve, Ability to verbalize feelings will improve, Ability to disclose and discuss suicidal ideas, Ability to identify and develop effective coping behaviors will improve, and Compliance with prescribed medications will improve  Medication Management: RN will administer medications as ordered by provider, will assess and evaluate patient's response and provide education to patient for prescribed medication. RN will report any adverse and/or side effects to prescribing provider.  Therapeutic Interventions: 1 on 1 counseling sessions, Psychoeducation, Medication administration, Evaluate responses to treatment, Monitor vital signs and CBGs as ordered, Perform/monitor CIWA, COWS, AIMS and Fall Risk screenings as ordered, Perform wound care treatments as ordered.  Evaluation of Outcomes: Not Progressing   LCSW Treatment Plan for Primary Diagnosis: MDD (major depressive disorder), recurrent episode, severe (HCC) Long Term Goal(s): Safe transition to appropriate next level of care at discharge, Engage patient in therapeutic group addressing interpersonal concerns.  Short Term  Goals: Engage patient in aftercare planning with referrals and resources, Increase social support, Increase ability to appropriately verbalize feelings, Increase emotional regulation, Facilitate acceptance of mental health diagnosis and concerns, Facilitate patient progression through stages of change regarding substance use diagnoses and concerns, Identify triggers associated with mental health/substance abuse issues, and Increase skills for wellness and recovery  Therapeutic Interventions: Assess for all discharge needs, 1 to 1 time with Social worker, Explore available resources and support systems, Assess for adequacy in community support network, Educate family and significant other(s) on suicide prevention, Complete Psychosocial Assessment, Interpersonal group therapy.  Evaluation of Outcomes: Not Progressing   Progress in Treatment: Attending groups: Yes. Participating in groups: Yes. Taking medication as prescribed: Yes. Toleration medication: Yes. Family/Significant other contact made: consents are pending Patient understands diagnosis: Yes. Discussing patient identified problems/goals with staff: Yes. Medical problems stabilized or resolved: Yes. Denies suicidal/homicidal ideation: Yes. Issues/concerns per patient self-inventory: No.  New problem(s) identified:  No  New Short Term/Long Term Goal(s):    medication stabilization, elimination of SI thoughts, development of comprehensive mental wellness plan.   Patient Goals:  "I want to find peace within myself and focus on ways to keep my mind busy."     Discharge Plan or Barriers:  Patient recently admitted. CSW will continue to follow and assess for appropriate referrals  and possible discharge planning.   Reason for Continuation of Hospitalization: Anxiety Depression Medication stabilization Suicidal ideation  Estimated Length of Stay:  5 - 7 days  Last 3 [place] Suicide Severity Risk Score: Flowsheet Row Admission  (Current[facility] from [facility]/29/2023 in [facility]  C-SSRS RISK CATEGORY High Risk High Risk       Last [facility] 2/9 Scores:    10/06/2016    3:22 PM 12/14/2015    8:05 AM  Depression screen PHQ 2/9  Decreased Interest 0 1  Down, Depressed, Hopeless 2 2  PHQ - 2 Score 2 3  Altered sleeping 2 1  Tired, decreased energy 2 1  Change in appetite 1 1  Feeling bad or failure about yourself  2 1  Trouble concentrating 1 1  Moving slowly or fidgety/restless 1 0  Suicidal thoughts 2 1  PHQ-9 Score 13 9    Scribe for Treatment Team: Henrry Feil O Samnang Shugars, LCSWA 05/01/2023 6:39 PM

## 2023-05-01 NOTE — Progress Notes (Signed)
   05/01/23 2145  Psych Admission Type (Psych Patients Only)  Admission Status Voluntary  Psychosocial Assessment  Patient Complaints None  Eye Contact Fair  Facial Expression Animated  Affect Apprehensive  Speech Logical/coherent  Interaction Assertive  Motor Activity Slow  Appearance/Hygiene Unremarkable  Behavior Characteristics Appropriate to situation  Mood Pleasant  Aggressive Behavior  Effect No apparent injury  Thought Process  Coherency WDL  Content WDL  Delusions WDL  Perception WDL  Hallucination None reported or observed  Judgment Poor  Confusion WDL  Danger to Self  Current suicidal ideation? Denies  Danger to Others  Danger to Others None reported or observed

## 2023-05-01 NOTE — Plan of Care (Signed)
   Problem: Education: Goal: Emotional status will improve Outcome: Progressing Goal: Mental status will improve Outcome: Progressing   Problem: Activity: Goal: Interest or engagement in activities will improve Outcome: Progressing Goal: Sleeping patterns will improve Outcome: Progressing

## 2023-05-02 DIAGNOSIS — F332 Major depressive disorder, recurrent severe without psychotic features: Secondary | ICD-10-CM | POA: Diagnosis not present

## 2023-05-02 LAB — RPR: RPR Ser Ql: NONREACTIVE

## 2023-05-02 MED ORDER — VITAMIN B-12 1000 MCG PO TABS
1000.0000 ug | ORAL_TABLET | Freq: Every day | ORAL | Status: DC
  Administered 2023-05-02 – 2023-05-03 (×2): 1000 ug via ORAL
  Filled 2023-05-02 (×5): qty 1

## 2023-05-02 MED ORDER — VITAMIN D (ERGOCALCIFEROL) 1.25 MG (50000 UNIT) PO CAPS
50000.0000 [IU] | ORAL_CAPSULE | Freq: Every day | ORAL | Status: DC
  Administered 2023-05-02 – 2023-05-03 (×2): 50000 [IU] via ORAL
  Filled 2023-05-02 (×5): qty 1

## 2023-05-02 NOTE — Progress Notes (Signed)
   05/02/23 0744  Psych Admission Type (Psych Patients Only)  Admission Status Voluntary  Psychosocial Assessment  Patient Complaints None  Eye Contact Fair  Facial Expression Animated  Affect Appropriate to circumstance  Speech Logical/coherent  Interaction Assertive  Motor Activity Other (Comment) (WDL)  Appearance/Hygiene Unremarkable  Behavior Characteristics Cooperative;Appropriate to situation  Mood Pleasant  Thought Process  Coherency WDL  Content WDL  Delusions None reported or observed  Perception WDL  Hallucination None reported or observed  Judgment Poor  Confusion None  Danger to Self  Current suicidal ideation? Denies  Agreement Not to Harm Self Yes  Description of Agreement Verbal  Danger to Others  Danger to Others None reported or observed

## 2023-05-02 NOTE — [facility] Group Notes (Signed)

## 2023-05-02 NOTE — [facility] Suicide Risk Assessment (Signed)
 [facility] INPATIENT:  Family/Significant Other Suicide Prevention Education  Suicide Prevention Education:  Education Completed; Cindy Cross (friend) [phone],  (name of family member/significant other) has been identified by the patient as the family member/significant other with whom the patient will be residing, and identified as the person(s) who will aid the patient in the event of a mental health crisis (suicidal ideations/suicide attempt).  With written consent from the patient, the family member/significant other has been provided the following suicide prevention education, prior to the and/or following the discharge of the patient.  No safety concerns about patient returning home, denies access to weapons or firearms, will pick patient up at discharge.  The suicide prevention education provided includes the following: Suicide risk factors Suicide prevention and interventions National Suicide Hotline telephone number [facility] assessment telephone number [facility] Emergency Assistance 911 [facility] and/or Residential Mobile Crisis Unit telephone number  Request made of family/significant other to: Remove weapons (e.g., guns, rifles, knives), all items previously/currently identified as safety concern.   Remove drugs/medications (over-the-counter, prescriptions, illicit drugs), all items previously/currently identified as a safety concern.  The family member/significant other verbalizes understanding of the suicide prevention education information provided.  The family member/significant other agrees to remove the items of safety concern listed above.  Cindy Cross 05/02/2023, 12:41 PM

## 2023-05-02 NOTE — Plan of Care (Signed)
   Problem: Education: Goal: Emotional status will improve Outcome: Progressing Goal: Mental status will improve Outcome: Progressing Goal: Verbalization of understanding the information provided will improve Outcome: Progressing

## 2023-05-02 NOTE — Group Note (Signed)
 Date:  05/02/2023 Time:  9:03 AM  Group Topic/Focus:  Goals Group:   The focus of this group is to help patients establish daily goals to achieve during treatment and discuss how the patient can incorporate goal setting into their daily lives to aide in recovery.    Participation Level:    Participation Quality:  Appropriate  Affect:  Appropriate  Cognitive:  Appropriate  Insight: Appropriate  Cindy Cross 05/02/2023, 9:03 AM

## 2023-05-02 NOTE — Group Note (Signed)
 LCSW Group Therapy Note   Group Date: 05/02/2023 Start Time: 1100 End Time: 1200  Participation:  did not attend  Type of Therapy:  Group Therapy  Topic:  "Finding Balance: Using Wise Mind for Thoughtful Decisions"  Objective:  the objective of this class is to help participants understand the concept of Wise Mind and learn how to apply it to real-life situations to make balanced, thoughtful decisions. Participants will gain tools to manage emotions, consider logic, and find a middle ground that leads to healthier responses and outcomes.  Goals: Understand the concept of Wise Mind.  Participants will learn the difference between Emotional Mind, Reasonable Mind, and Agustina Aldrich Mind, and how Agustina Aldrich Mind helps in balancing emotions and logic to make thoughtful decisions. Recognize when you're in Emotional Mind or Reasonable Mind.  Participants will identify the signs of Emotional Mind and Reasonable Mind in their own reactions to situations and understand how to move into Wise Mind for more balanced responses. Practice applying Agustina Aldrich Mind to real-life situations.  Through scenarios and group activities, participants will practice using Wise Mind in everyday situations, learning how to acknowledge their emotions, think logically, and create solutions that are thoughtful and balanced.  Summary: In this class, we explored the concept of Wise Mind--the balance between Emotional Mind and Reasonable Mind. We discussed how Emotional Mind can sometimes lead to impulsive, reactive decisions driven by intense feelings, and how Reasonable Mind might ignore feelings altogether, focusing only on facts and logic. Agustina Aldrich Mind is the middle ground that combines both, allowing you to consider your emotions and use logic to make balanced, thoughtful decisions.  We learned how to recognize when we're in Emotional Mind or Reasonable Mind, and practiced using Wise Mind in real-life situations. By using [place]ola [place], we can improve  how we handle challenging situations, make better decisions, and strengthen our relationships with others.  Therapeutic Modalities: Elements of DBT - emotional regulation   Janaa Acero O Ryenn Howeth, LCSWA 05/02/2023  1:20 PM

## 2023-05-02 NOTE — Group Note (Signed)
 Date:  05/02/2023 Time:  9:02 PM  Group Topic/Focus:  Wrap-Up Group:   The focus of this group is to help patients review their daily goal of treatment and discuss progress on daily workbooks.    Participation Level:  Active  Participation Quality:  Appropriate and Attentive  Affect:  Appropriate  Cognitive:  Alert and Appropriate  Insight: Appropriate and Good  Engagement in Group:  Engaged  Modes of Intervention:  Discussion and Education  Additional Comments:  Pt attended and participated in wrap up group this evening and rated their day a 6 or 7/10. Pt was stressed about D/C due to their move out date being 05/03/23. Pt has no other concerns besides being D/C tomorrow.   Cindy Cross 05/02/2023, 9:02 PM

## 2023-05-02 NOTE — Progress Notes (Signed)
 [facility] MD Progress Note  05/02/2023 6:31 PM Cindy Cross  MRN:  [id number] Subjective: Cindy Cross states, "I am fine now and getting ready for discharge " Principal Problem: MDD (major depressive disorder), recurrent episode, severe (HCC) Diagnosis: Principal Problem:   MDD (major depressive disorder), recurrent episode, severe (HCC) Active Problems:   GAD (generalized anxiety disorder)  Reason for admission: Cindy Cross is a 28 yr old Cross who presented on 04/30/2023 with prior psychiatric diagnoses significant for Depression and Anxiety, 2 Suicide Attempts (last- OD 04/11/2023), a remote history of Self Injurious Behavior ([employer]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  24 hours chart review: Patient compliance with scheduled psychotropic medications.  No as needed required.  No agitation protocol required.  Vital signs without critical values.  Today'Cross assessment notes: On assessment today, the pt reports that their mood is euthymic, improved since admission, and stable. Denies feeling down, depressed, or sad.  Reports that anxiety symptoms are at manageable level.  Sleep is stable. Appetite is stable.  Concentration is without complaint.  Energy level is adequate. Denies having any suicidal thoughts. Denies having any suicidal intent and plan.  Denies having any HI.  Denies having psychotic symptoms.   Denies having side effects to current psychiatric medications.   Discussed discharge planning:  How to identify the signs of impending crisis, use of internal coping strategies, reaching out to friends and family that can help navigate a crisis, and a list of mental health professionals and agencies to call. Further to follow up on her mental health appointments and her PCP appointments.   Past Psychiatric History: Depression and Anxiety, 2 Suicide Attempts (last- OD 04/11/2023), a remote history of Self Injurious Behavior ([employer]- last [employer]), and no  Prior Psychiatric Hospitalizations.   Past Medical History:  Past Medical History:  Diagnosis Date   Fracture    left fibula   Murmur, cardiac    Obesity     Past Surgical History:  Procedure Laterality Date   ORIF ANKLE FRACTURE Left 05/08/2017   Procedure: OPEN REDUCTION INTERNAL FIXATION (ORIF) LEFT ANKLE FRACTURE;  Surgeon: Janeth Medicus, MD;  Location: [facility] OR;  Service: Orthopedics;  Laterality: Left;   Family History:  Family History  Problem Relation Age of Onset   Cancer Maternal Aunt    Hypertension Maternal Grandmother    Stroke Maternal Grandmother    Heart disease Maternal Grandmother    Diabetes Maternal Grandfather    Hypertension Maternal Grandfather    Diabetes Mother    Asthma Mother    Family Psychiatric  History: See H&P Social History:  Social History   Substance and Sexual Activity  Alcohol Use Yes   Alcohol/week: 0.0 standard drinks of alcohol   Comment: occasional     Social History   Substance and Sexual Activity  Drug Use No    Social History   Socioeconomic History   Marital status: Single    Spouse name: Not on file   Number of children: Not on file   Years of education: Not on file   Highest education level: Not on file  Occupational History   Not on file  Tobacco Use   Smoking status: Never   Smokeless tobacco: Never  Vaping Use   Vaping status: Never Used  Substance and Sexual Activity   Alcohol use: Yes    Alcohol/week: 0.0 standard drinks of alcohol    Comment: occasional   Drug use: No   Sexual activity: Not on file  Other Topics Concern   Not on file  Social History Narrative   Not on file   Social Drivers of Health   Financial Resource Strain: Medium Risk (02/22/2023)   Received from [facility]   Overall Financial Resource Strain (CARDIA)    Difficulty of Paying Living Expenses: Somewhat hard  Food Insecurity: Food Insecurity Present (04/30/2023)   Hunger Vital Sign    Worried About Running Out of Food  in the Last Year: Sometimes true    Ran Out of Food in the Last Year: Sometimes true  Transportation Needs: No Transportation Needs (04/30/2023)   PRAPARE - Administrator, Civil Service (Medical): No    Lack of Transportation (Non-Medical): No  Physical Activity: Inactive (02/22/2023)   Received [facility]   Exercise Vital Sign    Days of Exercise per Week: 0 days    Minutes of Exercise per Session: 30 min  Stress: Stress Concern Present (02/22/2023)   Received from [facility] Occupational [facility] - Occupational Stress Questionnaire    Feeling of Stress : To some extent  Social Connections: Somewhat Isolated (02/22/2023)   Received from [facility]   Social Network    How would you rate your social network (family, work, friends)?: Restricted participation with some degree of social isolation   Additional Social History:     Sleep: Good  Appetite:  Good  Current Medications: Current Facility-Administered Medications  Medication Dose Route Frequency Provider Last Rate Last Admin   acetaminophen (TYLENOL) tablet 650 mg  650 mg Oral Q6H PRN Brent, Amanda C, NP       alum & mag hydroxide-simeth (MAALOX/MYLANTA) 200-200-20 MG/5ML suspension 30 mL  30 mL Oral Q4H PRN Brent, Amanda C, NP       buPROPion (WELLBUTRIN XL) 24 hr tablet 150 mg  150 mg Oral Daily Brent, Amanda C, NP   150 mg at 05/02/23 0744   cyanocobalamin (VITAMIN B12) tablet 1,000 mcg  1,000 mcg Oral Daily Cindy Cross, Cindy S, MD   1,000 mcg at 05/02/23 0981   haloperidol (HALDOL) tablet 5 mg  5 mg Oral TID PRN Brent, Amanda C, NP       And   diphenhydrAMINE (BENADRYL) capsule 50 mg  50 mg Oral TID PRN Brent, Amanda C, NP       haloperidol lactate (HALDOL) injection 5 mg  5 mg Intramuscular TID PRN Brent, Amanda C, NP       And   diphenhydrAMINE (BENADRYL) injection 50 mg  50 mg Intramuscular TID PRN Brent, Amanda C, NP       And   LORazepam (ATIVAN) injection 2 mg  2 mg  Intramuscular TID PRN Brent, Amanda C, NP       haloperidol lactate (HALDOL) injection 10 mg  10 mg Intramuscular TID PRN Brent, Amanda C, NP       And   diphenhydrAMINE (BENADRYL) injection 50 mg  50 mg Intramuscular TID PRN Brent, Amanda C, NP       And   LORazepam (ATIVAN) injection 2 mg  2 mg Intramuscular TID PRN Brent, Amanda C, NP       gabapentin (NEURONTIN) capsule 100 mg  100 mg Oral TID Brent, Amanda C, NP   100 mg at 05/02/23 1605   magnesium hydroxide (MILK OF MAGNESIA) suspension 30 mL  30 mL Oral Daily PRN Brent, Amanda C, NP       traZODone (DESYREL) tablet 50 mg  50 mg  Oral QHS PRN Brent, Amanda C, NP       Vitamin D (Ergocalciferol) (DRISDOL) 1.25 MG (50000 UNIT) capsule 50,000 Units  50,000 Units Oral Daily Cindy Cross, Cindy S, MD   50,000 Units at 05/02/23 0902   Lab Results:  Results for orders placed or performed during the hospital encounter of 04/30/23 (from the past 48 hours)  Folate     Status: None   Collection Time: 05/01/23  6:56 PM  Result Value Ref Range   Folate 10.0 >5.9 ng/mL    Comment: Performed at [facility], [address], [place], [place] [address]  RPR     Status: None   Collection Time: 05/01/23  6:56 PM  Result Value Ref Range   RPR Ser Ql NON REACTIVE NON REACTIVE    Comment: Performed at [facility], [address] [place], [place] [address]  Vitamin B12     Status: Abnormal   Collection Time: 05/01/23  6:56 PM  Result Value Ref Range   Vitamin B-12 125 (L) 180 - 914 pg/mL    Comment: (NOTE) This assay is not validated for testing neonatal or myeloproliferative syndrome specimens for Vitamin B12 levels. Performed at [facility], [address], [place], [place] [address]   VITAMIN D 25 Hydroxy (Vit-D Deficiency, Fractures)     Status: Abnormal   Collection Time: 05/01/23  6:56 PM  Result Value Ref Range   Vit D, 25-Hydroxy 20.73 (L) 30 - 100 ng/mL    Comment: (NOTE) Vitamin D deficiency has  been defined by the Institute of Medicine  and an Endocrine Society practice guideline as a level of serum 25-OH  vitamin D less than 20 ng/mL (1,2). The Endocrine Society went on to  further define vitamin D insufficiency as a level between 21 and 29  ng/mL (2).  1. IOM (Institute of Medicine). 2010. Dietary reference intakes for  calcium and D. [place]: [employer]. 2. Holick MF, Binkley [place], Bischoff-Ferrari HA, et al. Evaluation,  treatment, and prevention of vitamin D deficiency: an Endocrine  Society clinical practice guideline, JCEM. 2011 Jul; 96(7): 1911-30.  Performed at [facility], [address], [place], [place] [address]    Blood Alcohol level:  Lab Results  Component Value Date   [facility] <15 04/29/2023   Metabolic Disorder Labs: Lab Results  Component Value Date   HGBA1C 5.3 04/29/2023   MPG 105.41 04/29/2023   No results found for: "PROLACTIN" Lab Results  Component Value Date   CHOL 161 04/29/2023   TRIG 78 04/29/2023   HDL 73 04/29/2023   CHOLHDL 2.2 04/29/2023   VLDL 16 04/29/2023   LDLCALC 72 04/29/2023   Physical Findings: AIMS:  , ,  ,  ,    CIWA:    COWS:     Musculoskeletal: Strength & Muscle Tone: within normal limits Gait & Station: normal Patient leans: N/A  Psychiatric Specialty Exam:  Presentation  General Appearance:  Appropriate for Environment; Casual  Eye Contact: Good  Speech: Clear and Coherent  Speech Volume: Normal  Handedness: Right  Mood and Affect  Mood: Euthymic  Affect: Appropriate; Blunt  Thought Process  Thought Processes: Coherent  Descriptions of Associations:Intact  Orientation:Full (Time, Place and Person)  Thought Content:Logical  History of Schizophrenia/Schizoaffective disorder:No  Duration of Psychotic Symptoms:No data recorded Hallucinations:Hallucinations: None  Ideas of Reference:None  Suicidal Thoughts:Suicidal Thoughts: No SI Active Intent and/or  Plan: -- (Denies)  Homicidal Thoughts:Homicidal Thoughts:  No  Sensorium  Memory: Immediate Good; Recent Good  Judgment: Fair  Insight: Fair  Executive Functions  Concentration: Good  Attention Span: Good  Recall: Good  Fund of Knowledge: Good  Language: Good  Psychomotor Activity  Psychomotor Activity: Psychomotor Activity: Normal  Assets  Assets: Communication Skills; Desire for Improvement; Physical Health; Resilience  Sleep  Sleep: Sleep: Good Number of Hours of Sleep: 8  Physical Exam: Physical Exam Vitals and nursing note reviewed.  Constitutional:      Appearance: She is obese.  HENT:     Head: Normocephalic.     Right Ear: External ear normal.     Left Ear: External ear normal.     Nose: Nose normal.     Mouth/Throat:     Mouth: Mucous membranes are moist.     Pharynx: Oropharynx is clear.  Eyes:     Extraocular Movements: Extraocular movements intact.  Cardiovascular:     Rate and Rhythm: Normal rate.     Pulses: Normal pulses.  Pulmonary:     Effort: Pulmonary effort is normal.  Abdominal:     Comments: Deferred  Genitourinary:    Comments: Deferred Musculoskeletal:        General: Normal range of motion.     Cervical back: Normal range of motion.  Skin:    General: Skin is warm.  Neurological:     General: No focal deficit present.     Mental Status: She is alert and oriented to person, place, and time.  Psychiatric:        Mood and Affect: Mood normal.        Behavior: Behavior normal.        Thought Content: Thought content normal.    Review of Systems  Constitutional:  Negative for chills and fever.  HENT:  Negative for sore throat.   Eyes:  Negative for blurred vision.  Respiratory:  Negative for cough, sputum production, shortness of breath and wheezing.   Cardiovascular:  Negative for chest pain and palpitations.  Gastrointestinal:  Negative for heartburn and nausea.  Genitourinary:  Negative for dysuria,  frequency and urgency.  Musculoskeletal:  Negative for myalgias.  Skin:  Negative for itching and rash.  Neurological:  Negative for dizziness and headaches.  Endo/Heme/Allergies:        See allergy listing  Psychiatric/Behavioral:  Positive for depression. Negative for hallucinations, substance abuse and suicidal ideas. The patient is nervous/anxious. The patient does not have insomnia.    Blood pressure 123/77, pulse 65, temperature 98.2 F (36.8 C), temperature source Oral, resp. rate 16, height 5\' 3"  (1.6 m), weight 104.8 kg, SpO2 100%. Body mass index is 40.92 kg/m.   Treatment Plan Summary: Daily contact with patient to assess and evaluate symptoms and progress in treatment and Medication management Cindy Cross is a 28 yr old Cross who presented on  PPHx is significant for Depression and Anxiety, 2 Suicide Attempts (last- OD 04/11/2023), a remote history of Self Injurious Behavior ([employer]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     Cindy Cross does meet criteria for MDD and GAD.  Since she had multiple stressors converge on top of stopping her medications, restarting her medications which were helpful we will is appropriate.  She was already restarted on her Wellbutrin and gabapentin so we will continue with these  We will not make any other changes to her medication at this time.      MDD, Recurrent, Severe, w/out Psychosis  GAD: -Continue Wellbutrin XL  150 mg daily for depression and anxiety -Continue Gabapentin 100 mg TID for anxiety -Continue Agitation Protocol: Haldol/Ativan/Benadryl     -Continue PRN'Cross: Tylenol, Maalox, Milk of Magnesia, Trazodone     Observation Level/Precautions:  15 minute checks  Laboratory:  CMP: WNL,  CBC: WNL,  Lipid Panel: WNL,  TSH: 2.121,  A1c: 5.3,  UDS: Neg,  Urine Preg: Neg,  EKG: NSR w/ Qtc: 420  Psychotherapy:    Medications:  Wellbutrin, Gabapentin  Consultations:    Discharge Concerns:    Estimated LOS: 4-6 days   Other:      Physician Treatment Plan for Primary Diagnosis: MDD (major depressive disorder), recurrent episode, severe (HCC) Long Term Goal(Cross): Improvement in symptoms so as ready for discharge   Short Term Goals: Ability to identify changes in lifestyle to reduce recurrence of condition will improve, Ability to verbalize feelings will improve, Ability to disclose and discuss suicidal ideas, Ability to identify and develop effective coping behaviors will improve, and Ability to maintain clinical measurements within normal limits will improve   Physician Treatment Plan for Secondary Diagnosis: Principal Problem:   MDD (major depressive disorder), recurrent episode, severe (HCC) Active Problems:   GAD (generalized anxiety disorder)   Long Term Goal(Cross): Improvement in symptoms so as ready for discharge   Short Term Goals: Ability to identify changes in lifestyle to reduce recurrence of condition will improve, Ability to verbalize feelings will improve, Ability to disclose and discuss suicidal ideas, Ability to identify and develop effective coping behaviors will improve, and Ability to maintain clinical measurements within normal limits will improve   I certify that inpatient services furnished can reasonably be expected to improve the patient'Cross condition.   Cindy Pons, FNP 05/02/2023, 6:31 PM Patient ID: Cindy Cross, Cross   DOB: [birth year], 28 y.o.   MRN: [id number]

## 2023-05-03 DIAGNOSIS — F332 Major depressive disorder, recurrent severe without psychotic features: Secondary | ICD-10-CM | POA: Diagnosis not present

## 2023-05-03 MED ORDER — VITAMIN D (ERGOCALCIFEROL) 1.25 MG (50000 UNIT) PO CAPS: 50000.0000 [IU] | ORAL_CAPSULE | Freq: Every day | ORAL | 0 refills | Status: AC

## 2023-05-03 MED ORDER — CYANOCOBALAMIN 1000 MCG PO TABS: 1000.0000 ug | ORAL_TABLET | Freq: Every day | ORAL | 0 refills | Status: AC

## 2023-05-03 MED ORDER — TRAZODONE HCL 50 MG PO TABS: 50.0000 mg | ORAL_TABLET | Freq: Every evening | ORAL | 0 refills | Status: AC | PRN

## 2023-05-03 MED ORDER — BUPROPION HCL ER (XL) 150 MG PO TB24: 150.0000 mg | ORAL_TABLET | Freq: Every day | ORAL | 0 refills | Status: AC

## 2023-05-03 NOTE — Progress Notes (Signed)
 Patient ID: Cindy Cross, female   DOB: [birth year], 28 y.o.   MRN: [id number] Medications, discharge instruction and follow up appointments reviewed with patient, pt verbalized understanding. Belongings returned, pt discharged home.

## 2023-05-03 NOTE — Plan of Care (Signed)
  Problem: Education: Goal: Knowledge of [facility] Education information/materials will improve Outcome: Progressing   Problem: Activity: Goal: Interest or engagement in activities will improve Outcome: Progressing   Problem: Coping: Goal: Ability to verbalize frustrations and anger appropriately will improve Outcome: Progressing   Problem: Physical Regulation: Goal: Ability to maintain clinical measurements within normal limits will improve Outcome: Progressing   Problem: Safety: Goal: Periods of time without injury will increase Outcome: Progressing

## 2023-05-03 NOTE — [facility] Suicide Risk Assessment (Signed)
 Suicide Risk Assessment  Discharge Assessment    [facility] Discharge Suicide Risk Assessment   Principal Problem: MDD (major depressive disorder), recurrent episode, severe (HCC) Discharge Diagnoses: Principal Problem:   MDD (major depressive disorder), recurrent episode, severe (HCC) Active Problems:   GAD (generalized anxiety disorder)  Reason for admission: Cindy Cross is a 28 yr old female who presented on 04/30/2023 with PPHx is significant for Depression and Anxiety, 2 Suicide Attempts (last- OD 04/11/2023), a remote history of Self Injurious Behavior ([employer]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  Total Time spent with patient: 45 minutes  Musculoskeletal: Strength & Muscle Tone: within normal limits Gait & Station: normal Patient leans: N/A  Psychiatric Specialty Exam  Presentation  General Appearance:  Appropriate for Environment; Casual; Fairly Groomed  Eye Contact: Good  Speech: Clear and Coherent  Speech Volume: Normal  Handedness: Right  Mood and Affect  Mood: Euthymic  Duration of Depression Symptoms: Greater than two weeks  Affect: Appropriate; Congruent   Thought Process  Thought Processes: Coherent; Goal Directed  Descriptions of Associations:Intact  Orientation:Full (Time, Place and Person)  Thought Content:Logical  History of Schizophrenia/Schizoaffective disorder:No  Duration of Psychotic Symptoms:No data recorded Hallucinations:Hallucinations: None  Ideas of Reference:None  Suicidal Thoughts:Suicidal Thoughts: No SI Active Intent and/or Plan: -- (denies)  Homicidal Thoughts:Homicidal Thoughts: No  Sensorium  Memory: Immediate Good; Recent Good  Judgment: Fair  Insight: Fair   Art therapist  Concentration: Good  Attention Span: Good  Recall: Fair  Fund of Knowledge: Fair  Language: Good  Psychomotor Activity  Psychomotor Activity: Psychomotor Activity: Normal  Assets   Assets: Communication Skills; Physical Health; Resilience; Desire for Improvement   Sleep  Sleep: Sleep: Good Number of Hours of Sleep: 10   Physical Exam: Physical Exam Vitals and nursing note reviewed.  Constitutional:      Appearance: She is normal weight.  HENT:     Head: Normocephalic.     Right Ear: External ear normal.     Left Ear: External ear normal.     Nose: Nose normal.     Mouth/Throat:     Mouth: Mucous membranes are moist.     Pharynx: Oropharynx is clear.  Eyes:     Extraocular Movements: Extraocular movements intact.  Cardiovascular:     Rate and Rhythm: Normal rate.     Pulses: Normal pulses.  Pulmonary:     Effort: Pulmonary effort is normal.  Abdominal:     Comments: Deferred  Genitourinary:    Comments: Deferred  Musculoskeletal:        General: Normal range of motion.     Cervical back: Normal range of motion.  Skin:    General: Skin is warm.  Neurological:     General: No focal deficit present.     Mental Status: She is alert and oriented to person, place, and time.  Psychiatric:        Mood and Affect: Mood normal.        Behavior: Behavior normal.    Review of Systems  Constitutional:  Negative for chills and fever.  HENT:  Negative for sore throat.   Eyes:  Negative for blurred vision.  Respiratory:  Negative for cough, sputum production, shortness of breath and wheezing.   Cardiovascular:  Negative for chest pain and palpitations.  Gastrointestinal:  Negative for heartburn and nausea.  Genitourinary:  Negative for dysuria and urgency.  Musculoskeletal:  Negative for falls.  Skin:  Negative for itching and rash.  Neurological:  Negative for dizziness and headaches.  Endo/Heme/Allergies:        See allergy listing  Psychiatric/Behavioral:  Positive for depression (Stable with medication). Negative for hallucinations, substance abuse and suicidal ideas. The patient is nervous/anxious (Improved with medication). The patient does  not have insomnia.    Blood pressure (!) 124/91, pulse 65, temperature 98.2 F (36.8 C), temperature source Oral, resp. rate 16, height 5\' 3"  (1.6 m), weight 104.8 kg, SpO2 100%. Body mass index is 40.92 kg/m.  Mental Status Per Nursing Assessment::   On Admission:  Suicidal ideation indicated by others, Suicidal ideation indicated by patient  Demographic Factors:  Low socioeconomic status  Loss Factors: Financial problems/change in socioeconomic status  Historical Factors: Prior suicide attempts and Impulsivity  Risk Reduction Factors:   Employed, Positive social support, Positive therapeutic relationship, and Positive coping skills or problem solving skills  Continued Clinical Symptoms:  Severe Anxiety and/or Agitation Depression:   Recent sense of peace/wellbeing More than one psychiatric diagnosis Previous Psychiatric Diagnoses and Treatments Medical Diagnoses and Treatments/Surgeries  Cognitive Features That Contribute To Risk:  Polarized thinking    Suicide Risk:  Mild:  Suicidal ideation of limited frequency, intensity, duration, and specificity.  There are no identifiable plans, no associated intent, mild dysphoria and related symptoms, good self-control (both objective and subjective assessment), few other risk factors, and identifiable protective factors, including available and accessible social support.   Follow-up Information     [facility] Follow up on 05/15/2023.   Why: You have an appointment on 05/15/23 at 3:15 pm for medication management services, Virtual. Contact information: [address], [place], [place] [address] [email or web address]. Phone: [phone]. Fax: [phone].        [facility] Follow up on 05/05/2023.   Specialty: Professional Counselor Why: You have an appointment for therapy services on 05/05/23 at 9:00 am, in person. Contact information: [address] [place] [address] [phone]                 Plan Of Care/Follow-up recommendations:  Discharge Recommendations:    The patient is being discharged with her family.   Patient is to take her discharge medications as ordered. ?See follow up above.   We recommend that she participates in individual therapy to target uncontrollable agitation and substance abuse.    We recommend that she participates in therapy to target the conflict with her family, to improve communication skills and conflict resolution skills. ?Patient is to initiate/implement a contingency based behavioral model to address patient's behavior.   We recommend that she gets AIMS scale, height, weight, blood pressure, fasting lipid panel, fasting blood sugar in three months from discharge if she's on atypical antipsychotics.    Patient will benefit from monitoring of recurrent suicidal ideation since patient is on antidepressant medication.   The patient should abstain from all illicit substances and alcohol.   If the patient's symptoms worsen or do not continue to improve or if the patient becomes actively suicidal or homicidal then it is recommended that the patient return to the closest hospital emergency room or call 911 for further evaluation and treatment. National Suicide Prevention Lifeline 1800-SUICIDE or 416 301 6322.   Please follow up with your primary medical doctor for all other medical needs.    The patient has been educated on the possible side effects to medications and she/her guardian is to contact a medical professional and inform outpatient provider of any new side effects of medication.   She is to  take regular diet and activity as tolerated. ?Will benefit from moderate daily exercise.   Patient was educated about removing/locking any firearms, medications or dangerous products from the home.      Activity:  As tolerated   Diet:  Regular Diet   Laurence Pons, FNP 05/03/2023, 10:02 AM

## 2023-05-03 NOTE — Progress Notes (Addendum)
  [facility] Adult Case Management Discharge Plan :  Will you be returning to the same living situation after discharge:  Yes,  patient will return to her home At discharge, do you have transportation home?: Yes,  patient's friend, Olen Bern (friend) [phone] will pick her up at 11:30 AM Do you have the ability to pay for your medications: Yes,  patient has insurance  Release of information consent forms completed and in the chart;  Patient's signature needed at discharge.  Patient to Follow up at:  Follow-up Information     [facility] Follow up on 05/15/2023.   Why: You have an appointment on 05/15/23 at 3:15 pm for medication management services, Virtual. Contact information: [address], [address], [place], [place] [address] [email or web address]. Phone: [phone]. Fax: [phone].        [facility] Follow up on 05/05/2023.   Specialty: Professional Counselor Why: You have an appointment for therapy services on 05/05/23 at 9:00 am, in person. Contact information: [address] [place] [address] [phone]                 Next level of care provider has access to [facility] Link:no  Safety Planning and Suicide Prevention discussed: Yes,  Olen Bern (friend) [phone]  Has patient been referred to the Quitline?: Patient does not use tobacco/nicotine products  Patient has been referred for addiction treatment: No known substance use disorder.   Nickson Middlesworth O Sahiti Joswick, LCSWA 05/03/2023, 9:41 AM

## 2023-05-03 NOTE — Plan of Care (Signed)
  Problem: Education: Goal: Knowledge of [facility] Education information/materials will improve Outcome: Completed/Met Goal: Emotional status will improve 05/03/2023 1038 by Media Spikes, RN Outcome: Completed/Met 05/03/2023 0951 by Media Spikes, RN Outcome: Adequate for Discharge Goal: Mental status will improve 05/03/2023 1038 by Media Spikes, RN Outcome: Completed/Met 05/03/2023 0951 by Media Spikes, RN Outcome: Adequate for Discharge Goal: Verbalization of understanding the information provided will improve 05/03/2023 1038 by Media Spikes, RN Outcome: Completed/Met 05/03/2023 0951 by Media Spikes, RN Outcome: Adequate for Discharge   Problem: Activity: Goal: Interest or engagement in activities will improve Outcome: Adequate for Discharge Goal: Sleeping patterns will improve Outcome: Adequate for Discharge

## 2023-05-03 NOTE — Discharge Summary (Signed)
 Physician Discharge Summary Note  Patient:  Cindy Cross is an 28 y.o., female MRN:  [id number] DOB:  Oct 16, 1995 Patient phone:  [phone] (home)  Patient address:   [address] [place] [address],   Total Time spent with patient: 45 minutes  Date of Admission:  04/30/2023 Date of Discharge:   05/03/2023  Reason for Admission:  Cindy Cross is a 28 yr old female who presented on 04/30/2023 with PPHx is significant for Depression and Anxiety, 2 Suicide Attempts (last- OD 04/11/2023), a remote history of Self Injurious Behavior (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   Principal Problem: MDD (major depressive disorder), recurrent episode, severe (HCC) Discharge Diagnoses: Principal Problem:   MDD (major depressive disorder), recurrent episode, severe (HCC) Active Problems:   GAD (generalized anxiety disorder)  Past Psychiatric History: Depression and Anxiety, 2 Suicide Attempts (last- OD 04/11/2023), a remote history of Self Injurious Behavior (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   Past Medical History:  Past Medical History:  Diagnosis Date   Fracture    left fibula   Murmur, cardiac    Obesity     Past Surgical History:  Procedure Laterality Date   ORIF ANKLE FRACTURE Left 05/08/2017   Procedure: OPEN REDUCTION INTERNAL FIXATION (ORIF) LEFT ANKLE FRACTURE;  Surgeon: Janeth Medicus, MD;  Location: [facility] OR;  Service: Orthopedics;  Laterality: Left;   Family History:  Family History  Problem Relation Age of Onset   Cancer Maternal Aunt    Hypertension Maternal Grandmother    Stroke Maternal Grandmother    Heart disease Maternal Grandmother    Diabetes Maternal Grandfather    Hypertension Maternal Grandfather    Diabetes Mother    Asthma Mother    Family Psychiatric  History: See H&P Social History:  Social History   Substance and Sexual Activity  Alcohol Use Yes   Alcohol/week: 0.0  standard drinks of alcohol   Comment: occasional     Social History   Substance and Sexual Activity  Drug Use No    Social History   Socioeconomic History   Marital status: Single    Spouse name: Not on file   Number of children: Not on file   Years of education: Not on file   Highest education level: Not on file  Occupational History   Not on file  Tobacco Use   Smoking status: Never   Smokeless tobacco: Never  Vaping Use   Vaping status: Never Used  Substance and Sexual Activity   Alcohol use: Yes    Alcohol/week: 0.0 standard drinks of alcohol    Comment: occasional   Drug use: No   Sexual activity: Not on file  Other Topics Concern   Not on file  Social History Narrative   Not on file   Social Drivers of Health   Financial Resource Strain: Medium Risk (02/22/2023)   Received from [facility]   Overall Financial Resource Strain (CARDIA)    Difficulty of Paying Living Expenses: Somewhat hard  Food Insecurity: Food Insecurity Present (04/30/2023)   Hunger Vital Sign    Worried About Running Out of Food in the Last Year: Sometimes true    Ran Out of Food in the Last Year: Sometimes true  Transportation Needs: No Transportation Needs (04/30/2023)   PRAPARE - Administrator, Civil Service (Medical): No    Lack of Transportation (Non-Medical): No  Physical Activity: Inactive (02/22/2023)   Received from [facility]  Exercise Vital Sign    Days of Exercise per Week: 0 days    Minutes of Exercise per Session: 30 min  Stress: Stress Concern Present (02/22/2023)   Received from [facility] - Occupational Stress Questionnaire    Feeling of Stress : To some extent  Social Connections: Somewhat Isolated (02/22/2023)   Received from [facility]   Social Network    How would you rate your social network (family, work, friends)?: Restricted participation with some degree of social isolation   Hospital Course:   During the patient's hospitalization, patient had extensive initial psychiatric evaluation, and follow-up psychiatric evaluations every day.  Psychiatric diagnoses provided upon initial assessment:  Diagnosis:  Principal Problem:   MDD (major depressive disorder), recurrent episode, severe (HCC) Active Problems:   GAD (generalized anxiety disorder)  Patient's psychiatric medications were adjusted on admission:  -Continue Wellbutrin XL 150 mg daily for depression and anxiety -Continue Gabapentin 100 mg TID for anxiety -Continue Agitation Protocol: Haldol/Ativan/Benadryl   During the hospitalization, other adjustments were made to the patient's psychiatric medication regimen:  None  Patient's care was discussed during the interdisciplinary team meeting every day during the hospitalization.  The patient denies having side effects to prescribed psychiatric medication.  Gradually, patient started adjusting to milieu. The patient was evaluated each day by a clinical provider to ascertain response to treatment. Improvement was noted by the patient's report of decreasing symptoms, improved sleep and appetite, affect, medication tolerance, behavior, and participation in unit programming.  Patient was asked each day to complete a self inventory noting mood, mental status, pain, new symptoms, anxiety and concerns.    Symptoms were reported as significantly decreased or resolved completely by discharge.   On day of discharge, the patient reports that their mood is stable. The patient denied having suicidal thoughts for more than 48 hours prior to discharge.  Patient denies having homicidal thoughts.  Patient denies having auditory hallucinations.  Patient denies any visual hallucinations or other symptoms of psychosis. The patient was motivated to continue taking medication with a goal of continued improvement in mental health.   The patient reports their target psychiatric symptoms of depression  responded well to the psychiatric medications, and the patient reports overall benefit other psychiatric hospitalization. Supportive psychotherapy was provided to the patient. The patient also participated in regular group therapy while hospitalized. Coping skills, problem solving as well as relaxation therapies were also part of the unit programming.  Labs were reviewed with the patient, and abnormal results were discussed with the patient.  The patient is able to verbalize their individual safety plan to this provider.  # It is recommended to the patient to continue psychiatric medications as prescribed, after discharge from the hospital.    # It is recommended to the patient to follow up with your outpatient psychiatric provider and PCP.  # It was discussed with the patient, the impact of alcohol, drugs, tobacco have been there overall psychiatric and medical wellbeing, and total abstinence from substance use was recommended the patient.ed.  # Prescriptions provided or sent directly to preferred pharmacy at discharge. Patient agreeable to plan. Given opportunity to ask questions. Appears to feel comfortable with discharge.    # In the event of worsening symptoms, the patient is instructed to call the crisis hotline, 911 and or go to the nearest ED for appropriate evaluation and treatment of symptoms. To follow-up with primary care provider for other medical issues, concerns and or  health care needs  # Patient was discharged to home with a plan to follow up as noted below.   Physical Findings: AIMS:  , ,  ,  ,    CIWA:    COWS:     Musculoskeletal: Strength & Muscle Tone: within normal limits Gait & Station: normal Patient leans: N/A  Psychiatric Specialty Exam:  Presentation  General Appearance:  Appropriate for Environment; Casual; Fairly Groomed  Eye Contact: Good  Speech: Clear and Coherent  Speech Volume: Normal  Handedness: Right  Mood and Affect   Mood: Euthymic  Affect: Appropriate; Congruent  Thought Process  Thought Processes: Coherent; Goal Directed  Descriptions of Associations:Intact  Orientation:Full (Time, Place and Person)  Thought Content:Logical  History of Schizophrenia/Schizoaffective disorder:No  Duration of Psychotic Symptoms:No data recorded Hallucinations:Hallucinations: None  Ideas of Reference:None  Suicidal Thoughts:Suicidal Thoughts: No SI Active Intent and/or Plan: -- (denies)  Homicidal Thoughts:Homicidal Thoughts: No  Sensorium  Memory: Immediate Good; Recent Good  Judgment: Fair  Insight: Fair  Art therapist  Concentration: Good  Attention Span: Good  Recall: Fair  Fund of Knowledge: Fair  Language: Good  Psychomotor Activity  Psychomotor Activity: Psychomotor Activity: Normal  Assets  Assets: Communication Skills; Physical Health; Resilience; Desire for Improvement  Sleep  Sleep: Sleep: Good Number of Hours of Sleep: 10  Physical Exam: Physical Exam Vitals and nursing note reviewed.  Constitutional:      Appearance: She is obese.  HENT:     Head: Normocephalic.     Right Ear: External ear normal.     Left Ear: External ear normal.     Nose: Nose normal.     Mouth/Throat:     Mouth: Mucous membranes are moist.     Pharynx: Oropharynx is clear.  Eyes:     Extraocular Movements: Extraocular movements intact.  Cardiovascular:     Pulses: Normal pulses.  Pulmonary:     Effort: Pulmonary effort is normal.  Abdominal:     Comments: Deferred  Genitourinary:    Comments: Deferred  Musculoskeletal:        General: Normal range of motion.     Cervical back: Normal range of motion.  Skin:    General: Skin is warm.  Neurological:     General: No focal deficit present.     Mental Status: She is alert and oriented to person, place, and time.  Psychiatric:        Mood and Affect: Mood normal.        Behavior: Behavior normal.    Review of  Systems  Constitutional:  Negative for chills and fever.  HENT:  Negative for sore throat.   Eyes:  Negative for blurred vision.  Respiratory:  Negative for cough, sputum production, shortness of breath and wheezing.   Cardiovascular:  Negative for chest pain and palpitations.  Gastrointestinal:  Negative for abdominal pain, constipation, diarrhea, heartburn, nausea and vomiting.  Genitourinary:  Negative for dysuria, frequency and urgency.  Musculoskeletal:  Negative for falls.  Skin:  Negative for itching and rash.  Neurological:  Negative for dizziness and headaches.  Endo/Heme/Allergies:        See allergy listing  Psychiatric/Behavioral:  Positive for depression. Negative for hallucinations, substance abuse and suicidal ideas. The patient is nervous/anxious. The patient does not have insomnia.    Blood pressure (!) 124/91, pulse 65, temperature 98.2 F (36.8 C), temperature source Oral, resp. rate 16, height 5\' 3"  (1.6 m), weight 104.8 kg, SpO2 100%. Body mass index is  40.92 kg/m.   Social History   Tobacco Use  Smoking Status Never  Smokeless Tobacco Never   Tobacco Cessation:  N/A, patient does not currently use tobacco products   Blood Alcohol level:  Lab Results  Component Value Date   [facility] <15 04/29/2023    Metabolic Disorder Labs:  Lab Results  Component Value Date   HGBA1C 5.3 04/29/2023   MPG 105.41 04/29/2023   No results found for: "PROLACTIN" Lab Results  Component Value Date   CHOL 161 04/29/2023   TRIG 78 04/29/2023   HDL 73 04/29/2023   CHOLHDL 2.2 04/29/2023   VLDL 16 04/29/2023   LDLCALC 72 04/29/2023    See Psychiatric Specialty Exam and Suicide Risk Assessment completed by Attending Physician prior to discharge.  Discharge destination:  Home  Is patient on multiple antipsychotic therapies at discharge:  No   Has Patient had three or more failed trials of antipsychotic monotherapy by history:  No  Recommended Plan for Multiple  Antipsychotic Therapies: NA  Discharge Instructions     Increase activity slowly   Complete by: As directed       Allergies as of 05/03/2023   No Known Allergies      Medication List     STOP taking these medications    Nexplanon 68 MG Impl implant Generic drug: etonogestrel       TAKE these medications      Indication  buPROPion 150 MG 24 hr tablet Commonly known as: WELLBUTRIN XL Take 1 tablet (150 mg total) by mouth daily. Start taking on: May 04, 2023 What changed:  medication strength how much to take  Indication: Depression   cyanocobalamin 1000 MCG tablet Take 1 tablet (1,000 mcg total) by mouth daily. Start taking on: May 04, 2023  Indication: Inadequate Vitamin B12   gabapentin 100 MG capsule Commonly known as: NEURONTIN Take 100 mg by mouth 3 (three) times daily.  Indication: Generalized Anxiety Disorder   traZODone 50 MG tablet Commonly known as: DESYREL Take 1 tablet (50 mg total) by mouth at bedtime as needed for sleep.  Indication: Trouble Sleeping   Vitamin D (Ergocalciferol) 1.25 MG (50000 UNIT) Caps capsule Commonly known as: DRISDOL Take 1 capsule (50,000 Units total) by mouth daily. Start taking on: May 04, 2023  Indication: Vitamin D Deficiency        Follow-up Information     [facility] Follow up on 05/15/2023.   Why: You have an appointment on 05/15/23 at 3:15 pm for medication management services, Virtual. Contact information: [address], [place], [place] [address] [email or web address]. Phone: [phone]. Fax: [phone].        [facility] Follow up on 05/05/2023.   Specialty: Professional Counselor Why: You have an appointment for therapy services on 05/05/23 at 9:00 am, in person. Contact information: [address] [place] [address] [phone]                Follow-up recommendations:   Discharge Recommendations:    The patient is being discharged with her family.    Patient is to take her discharge medications as ordered. ?See follow up above.   We recommend that she participates in individual therapy to target uncontrollable agitation and substance abuse.    We recommend that she participates in therapy to target the conflict with her family, to improve communication skills and conflict resolution skills. ?patient is to initiate/implement a contingency based behavioral model to address patient's behavior.   We recommend  that she gets AIMS scale, height, weight, blood pressure, fasting lipid panel, fasting blood sugar in three months from discharge if she's on atypical antipsychotics.    Patient will benefit from monitoring of recurrent suicidal ideation since patient is on antidepressant medication.   The patient should abstain from all illicit substances and alcohol.   If the patient's symptoms worsen or do not continue to improve or if the patient becomes actively suicidal or homicidal then it is recommended that the patient return to the closest hospital emergency room or call 911 for further evaluation and treatment. National Suicide Prevention Lifeline 1800-SUICIDE or [phone].   Please follow up with your primary medical doctor for all other medical needs.    The patient has been educated on the possible side effects to medications and she/her guardian is to contact a medical professional and inform outpatient provider of any new side effects of medication.   She is to take regular diet and activity as tolerated. ?Will benefit from moderate daily exercise.   Patient was educated about removing/locking any firearms, medications or dangerous products from the home.   Activity:  As tolerated   Diet:  Regular Diet    Signed: Laurence Pons, FNP 05/03/2023, 10:25 AM

## 2023-05-03 NOTE — Plan of Care (Signed)
  Problem: Education: Goal: Knowledge of [facility] Education information/materials will improve Outcome: Completed/Met Goal: Emotional status will improve Outcome: Adequate for Discharge Goal: Mental status will improve Outcome: Adequate for Discharge Goal: Verbalization of understanding the information provided will improve Outcome: Adequate for Discharge

## 2023-05-03 NOTE — Group Note (Signed)
 Date:  05/03/2023 Time:  12:00 PM  Group Topic/Focus:  Goals Group:   The focus of this group is to help patients establish daily goals to achieve during treatment and discuss how the patient can incorporate goal setting into their daily lives to aide in recovery. Orientation:   The focus of this group is to educate the patient on the purpose and policies of crisis stabilization and provide a format to answer questions about their admission.  The group details unit policies and expectations of patients while admitted.    Participation Level:  Minimal  Participation Quality:  Appropriate  Affect:  Appropriate  Cognitive:  Appropriate  Insight: Appropriate  Engagement in Group:  Developing/Improving  Modes of Intervention:  Discussion and Orientation  Additional Comments:    Deionte Spivack D Benino Korinek 05/03/2023, 12:00 PM

## 2023-05-03 NOTE — Progress Notes (Signed)
   05/02/23 2200  Psych Admission Type (Psych Patients Only)  Admission Status Voluntary  Psychosocial Assessment  Patient Complaints None  Eye Contact Fair  Facial Expression Animated  Affect Appropriate to circumstance  Speech Logical/coherent  Interaction Assertive  Motor Activity Slow  Appearance/Hygiene Unremarkable  Behavior Characteristics Cooperative;Appropriate to situation  Mood Pleasant  Thought Process  Coherency WDL  Content WDL  Delusions None reported or observed  Perception WDL  Hallucination None reported or observed  Judgment Poor  Confusion None  Danger to Self  Current suicidal ideation? Denies  Description of Suicide Plan none  Agreement Not to Harm Self Yes  Description of Agreement verbal  Danger to Others  Danger to Others None reported or observed

## 2023-08-14 ENCOUNTER — Ambulatory Visit (INDEPENDENT_AMBULATORY_CARE_PROVIDER_SITE_OTHER): Admitting: Psychology

## 2023-08-14 ENCOUNTER — Encounter: Payer: Self-pay | Admitting: Psychology

## 2023-08-14 DIAGNOSIS — F411 Generalized anxiety disorder: Secondary | ICD-10-CM | POA: Diagnosis not present

## 2023-08-14 DIAGNOSIS — F331 Major depressive disorder, recurrent, moderate: Secondary | ICD-10-CM | POA: Diagnosis not present
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
## 2023-08-14 NOTE — Progress Notes (Signed)
   Cindy Dames, PhD

## 2023-08-21 ENCOUNTER — Ambulatory Visit (INDEPENDENT_AMBULATORY_CARE_PROVIDER_SITE_OTHER): Admitting: Psychology

## 2023-08-21 ENCOUNTER — Encounter: Payer: Self-pay | Admitting: Psychology

## 2023-08-21 DIAGNOSIS — F411 Generalized anxiety disorder: Secondary | ICD-10-CM

## 2023-08-21 DIAGNOSIS — F331 Major depressive disorder, recurrent, moderate: Secondary | ICD-10-CM

## 2023-08-21 NOTE — Progress Notes (Signed)
 [facility] Counselor/Therapist Progress Note  Patient ID: Cindy Cross, MRN: [id number],    Date: 08/21/2023  Time Spent: 12:30 - 3:00pm   Treatment Type: Testing  Met with patient for testing session.  Patient was at the clinic and session was conducted from therapist's office in person.    Reported Symptoms/Reason for Referral: Patient presents with a history of anxiety and depressed mood, in part related to childhood trauma/neglect. Those issues are being addressed with therapy and medication but patient suspects other possible metal health conditions due to difficulty with distractibility, organization, social interaction, adjusting to change and sensory hypersensitivity. Testing recommended to evaluate for a neurodevelopmental disorder along with other conditions that may be affecting social interaciton, attention, and behavior.   Mental Status Exam: Appearance:  Casual and Neatly Groomed     Behavior: Appropriate  Motor: Normal  Speech/Language:  Clear and Coherent and Normal Rate  Affect: Congruent, appropriate  Mood: euthymic  Thought process: normal  Thought content:   WNL  Sensory/Perceptual disturbances:   WNL  Orientation: oriented to person, place, time/date, and situation  Attention: Good  Concentration: Good  Memory: WNL  Fund of knowledge:  Good  Insight:   Good  Judgment:  Good  Impulse Control: Good   Risk Assessment: Danger to Self:  No Self-injurious Behavior: No Danger to Others: No Duty to Warn:no Physical Aggression / Violence:No   Subjective: Testing included the K-BIT-2 (1.0 hrs. for testing and scoring) along with the CNS Vital signs (1.0 hrs.) BRIEF-2A (0.25 hrs.), and CAARS-2 (0.25 hrs.).      Patient was cooperative and displayed good effort, showing much persistence on challenging items leading to a longer than expected completion time for IQ and neurocognitive testing.  Attention and concentration were adequate overall,  although patient exhibited several instances of asking questions to be repeated and missing relatively easy problems or questions.  Mood was euthymic with appropriate affect.  The results appear representative of current functioning.    Total time for testing: 2.5 hrs.  Diagnosis:GAD (generalized anxiety disorder)  Major depressive disorder, recurrent episode, moderate (HCC)  Plan: Testing to continue next session with the ADOS 2 Module 4 and SRS-2 followed by report writing and interactive feedback.     Kealohilani Maiorino, PhD

## 2023-08-21 NOTE — Progress Notes (Signed)
   Bryson Dames, PhD

## 2023-08-23 ENCOUNTER — Ambulatory Visit (INDEPENDENT_AMBULATORY_CARE_PROVIDER_SITE_OTHER): Admitting: Psychology

## 2023-08-23 ENCOUNTER — Encounter: Payer: Self-pay | Admitting: Psychology

## 2023-08-23 DIAGNOSIS — F331 Major depressive disorder, recurrent, moderate: Secondary | ICD-10-CM | POA: Diagnosis not present

## 2023-08-23 DIAGNOSIS — F411 Generalized anxiety disorder: Secondary | ICD-10-CM | POA: Diagnosis not present

## 2023-08-23 NOTE — Progress Notes (Signed)
 [facility] Counselor/Therapist Progress Note  Patient ID: Cindy Cross, MRN: [id number],    Date: 08/23/2023  Time Spent: 12:30 - 2:30pm   Treatment Type: Testing  Met with patient for testing session.  Patient was at the clinic and session was conducted from therapist's office in person.    Reported Symptoms/Reason for Referral: Patient presents with a history of anxiety and depressed mood, in part related to childhood trauma/neglect. Those issues are being addressed with therapy and medication but patient suspects other possible metal health conditions due to difficulty with distractibility, organization, social interaction, adjusting to change and sensory hypersensitivity. Testing recommended to evaluate for a neurodevelopmental disorder along with other conditions that may be affecting social interaciton, attention, and behavior.   Mental Status Exam: Appearance:  Casual and Neatly Groomed     Behavior: Appropriate  Motor: Normal  Speech/Language:  Clear and Coherent and Normal Rate  Affect: Congruent, appropriate  Mood: Euthymic mildly anxious in beginning  Thought process: normal  Thought content:   WNL  Sensory/Perceptual disturbances:   WNL  Orientation: oriented to person, place, time/date, and situation  Attention: Good  Concentration: Good  Memory: WNL  Fund of knowledge:  Good  Insight:   Good  Judgment:  Good  Impulse Control: Good   Risk Assessment: Danger to Self:  No Self-injurious Behavior: No Danger to Others: No Duty to Warn:no Physical Aggression / Violence:No   Subjective: Testing included the ADOS 2 Module 4 (1.75 hrs. for testing and scoring) along with the SRS-2 (0.25 hrs.).  The SRS-2 was sent to her friend and roommate to complete as well.      Patient was cooperative and displayed good effort, showing much persistence on challenging items leading to a longer than expected completion time for IQ and neurocognitive testing.   Attention and concentration were adequate overall, although patient exhibited several instances of asking questions to be repeated and missing relatively easy problems or questions.  Mood was mildly anxious in the beginning but overall euthymic with appropriate affect.  Patient infrequently initiated interaction but was highly reciprocal when responding to social initiations from the examiner. The results appear representative of current functioning.    Total time for testing: 2.0 hrs.  Diagnosis:GAD (generalized anxiety disorder)  Major depressive disorder, recurrent episode, moderate (HCC)  Plan: Testing complete. Report writing to be conducted followed by interactive feedback next session.       Mekenna Finau, PhD                  Magali Bray, PhD

## 2023-08-24 ENCOUNTER — Encounter: Payer: Self-pay | Admitting: Psychology

## 2023-08-24 NOTE — Progress Notes (Signed)
 Cindy Cross is a 28 y.o. female patient.  Report writing competed ( 3 hrs.).  Interactive feedback to be conducted next session. Report to be attached to the feedback progress note.  Patient/Guardian was advised Release of Information must be obtained prior to any record release in order to collaborate their care with an outside provider. Patient/Guardian was advised if they have not already done so to contact the registration department to sign all necessary forms in order for us  to release information regarding their care.   Consent: Patient/Guardian gives verbal consent for treatment and assignment of benefits for services provided during this visit. Patient/Guardian expressed understanding and agreed to proceed.    Xochilth Standish, PhD

## 2023-09-06 ENCOUNTER — Ambulatory Visit (INDEPENDENT_AMBULATORY_CARE_PROVIDER_SITE_OTHER): Admitting: Psychology

## 2023-09-06 ENCOUNTER — Encounter: Payer: Self-pay | Admitting: Psychology

## 2023-09-06 DIAGNOSIS — F4389 Other reactions to severe stress: Secondary | ICD-10-CM

## 2023-09-06 DIAGNOSIS — F331 Major depressive disorder, recurrent, moderate: Secondary | ICD-10-CM

## 2023-09-06 DIAGNOSIS — F439 Reaction to severe stress, unspecified: Secondary | ICD-10-CM

## 2023-09-06 DIAGNOSIS — F411 Generalized anxiety disorder: Secondary | ICD-10-CM | POA: Diagnosis not present

## 2023-09-06 NOTE — Progress Notes (Signed)
 [facility] Counselor/Therapist Progress Note  Patient ID: Jung Ingerson, MRN: [id number],    Date: 09/06/2023  Time Spent: 4:00 - 4:35 pm   Treatment Type: Testing - Feedback Session  Met with patient to review results of testing.  Patient was at home and session was conducted from therapist's office via video conferencing.  Patient understood the limitations of video appointments and verbally consented to telehealth.       Reported Symptoms: Patient presents with a history of anxiety and depressed mood, in part related to childhood trauma/neglect. Those issues are being addressed with therapy and medication but patient suspects other possible metal health conditions due to difficulty with distractibility, organization, social interaction, adjusting to change and sensory hypersensitivity. Testing recommended to evaluate for a neurodevelopmental disorder along with other conditions that may be affecting social interaciton, attention, and behavior.   Subjective: Interactive feedback was conducted (1 hr.).  It was discussed how patient did not meet the criterion for Autism Spectrum Disorder or ADHD along with how her history of trauma affects her ability to concentrate and relate to others.  Recommendations included discussing results with PCP, developing a visual organization system, and continuing individual counseling to help manage the effects of past trauma.  Patient expressed agreement with the results and recommendations.     Total Time: 4 hrs. Interactive Feedback:1 hr. Report Writing: 3 hrs.   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  Plan: Report to be sent to patient and referring provider if applicable.     Luan Maberry, PhD

## 2023-09-06 NOTE — Progress Notes (Signed)
   Cindy Dames, PhD

## 2023-09-06 NOTE — Progress Notes (Signed)
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 slightly elevated atypical behavior.  The results do not meet the criteria for ASD or ADHD, as clinically significant impaired functioning and interfering behavior patterns were not noted across settings based on developmental history observation, and rating scales.  In general, Ms. Noll's behavior patterns appear more related to the effects of trauma than a neurodevelopmental disorder with hypervigilance resulting in excessive worry, social anxiety, lack of trust, and intermittent depressed mood.  See below for recommendations.          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  Recommendations: Referral  back to primary care. Continue medication for managing anxiety and depressed mood.  Medication is not recommended for attention deficits as they appear more trauma related and the high arousal from stimulant medication could trigger a traumatic response.  Medication for emotion/anxiety regulation is recommended to continue.   Emotion regulation and attention can also be increased by increasing physical conditioning through adequate sleep, good nutrition and exercise. Individual counseling is recommended to continue with an emphasis on helping Ms. Lerner reconcile with childhood trauma and learn to better regulate her behavior and emotion.  A skills-based approach such as Acceptance and Commitment Therapy or Dialectical Behavior Therapy (DBT), is recommended considering her intense emotion regulation difficulty and history of trauma.  The trauma may also need to be addressed through exercises such as Eye Movement Desensitization Retraining (EMDR), Brain Spotting, and Tapping/EFT.  A therapist experienced in trauma is recommended.  [facility] provides group and individual therapy using DBT.      Socially, Ms. Runyon would benefit from participation in social activity like clubs or interest groups that are small and structured along with having clear rules, guidelines, and activities. Continue to encourage Ms. Birmingham to do as much on her own as she can and provide cues to help her initiate and complete activities, including developing a picture checklist with the specific steps shown to her.  Ms. Zollinger may be able to do more than she is currently doing if she is given the structure and time to complete activities.      Elspeth MOTE Namish Krise, Ph.D. Licensed Psychologist - [facility] [phone]               Humaira Sculley, PhD

## 2023-09-15 ENCOUNTER — Other Ambulatory Visit: Payer: Self-pay | Admitting: Medical Genetics

## 2023-11-02 ENCOUNTER — Other Ambulatory Visit: Payer: PRIVATE HEALTH INSURANCE

## 2023-11-14 ENCOUNTER — Other Ambulatory Visit (HOSPITAL_COMMUNITY)
Admission: RE | Admit: 2023-11-14 | Discharge: 2023-11-14 | Disposition: A | Discharge status: Home / Self Care | Payer: PRIVATE HEALTH INSURANCE | Source: Ambulatory Visit | Attending: Medical Genetics | Admitting: Medical Genetics

## 2023-11-24 LAB — GENECONNECT MOLECULAR SCREEN: Genetic Analysis Overall Interpretation: NEGATIVE
